# Patient Record
Sex: Female | Born: 1975
Health system: Southern US, Community
[De-identification: ages and names within clinical notes are randomized; demographics above are authoritative.]

## PROBLEM LIST (undated history)

## (undated) ENCOUNTER — Inpatient Hospital Stay (HOSPITAL_COMMUNITY): Payer: Self-pay

## (undated) DIAGNOSIS — B009 Herpesviral infection, unspecified: Secondary | ICD-10-CM

## (undated) DIAGNOSIS — D649 Anemia, unspecified: Secondary | ICD-10-CM

## (undated) DIAGNOSIS — N840 Polyp of corpus uteri: Secondary | ICD-10-CM

## (undated) DIAGNOSIS — R011 Cardiac murmur, unspecified: Secondary | ICD-10-CM

## (undated) DIAGNOSIS — Z5189 Encounter for other specified aftercare: Secondary | ICD-10-CM

## (undated) HISTORY — PX: DILATION AND CURETTAGE OF UTERUS: SHX78

## (undated) HISTORY — PX: WISDOM TOOTH EXTRACTION: SHX21

## (undated) HISTORY — PX: OTHER SURGICAL HISTORY: SHX169

---

## 2013-02-09 ENCOUNTER — Encounter: Payer: Self-pay | Admitting: Family Medicine

## 2013-12-04 ENCOUNTER — Observation Stay (HOSPITAL_COMMUNITY)
Admission: EM | Admit: 2013-12-04 | Discharge: 2013-12-05 | Disposition: A | Discharge status: Home / Self Care | Payer: BC Managed Care – PPO | Attending: Internal Medicine | Admitting: Internal Medicine

## 2013-12-04 ENCOUNTER — Other Ambulatory Visit: Payer: Self-pay

## 2013-12-04 ENCOUNTER — Encounter (HOSPITAL_COMMUNITY): Payer: Self-pay | Admitting: Emergency Medicine

## 2013-12-04 DIAGNOSIS — N920 Excessive and frequent menstruation with regular cycle: Secondary | ICD-10-CM | POA: Insufficient documentation

## 2013-12-04 DIAGNOSIS — E861 Hypovolemia: Secondary | ICD-10-CM | POA: Insufficient documentation

## 2013-12-04 DIAGNOSIS — R55 Syncope and collapse: Secondary | ICD-10-CM

## 2013-12-04 DIAGNOSIS — D62 Acute posthemorrhagic anemia: Principal | ICD-10-CM | POA: Insufficient documentation

## 2013-12-04 DIAGNOSIS — R011 Cardiac murmur, unspecified: Secondary | ICD-10-CM | POA: Insufficient documentation

## 2013-12-04 HISTORY — DX: Encounter for other specified aftercare: Z51.89

## 2013-12-04 HISTORY — DX: Herpesviral infection, unspecified: B00.9

## 2013-12-04 HISTORY — DX: Anemia, unspecified: D64.9

## 2013-12-04 HISTORY — DX: Polyp of corpus uteri: N84.0

## 2013-12-04 HISTORY — DX: Cardiac murmur, unspecified: R01.1

## 2013-12-04 LAB — COMPREHENSIVE METABOLIC PANEL
Albumin: 3.8 g/dL (ref 3.5–5.2)
Alkaline Phosphatase: 47 U/L (ref 39–117)
BUN: 15 mg/dL (ref 6–23)
Calcium: 8.6 mg/dL (ref 8.4–10.5)
Chloride: 104 mEq/L (ref 96–112)
GFR calc Af Amer: >90 mL/min (ref >90)
Glucose, Bld: 84 mg/dL (ref 70–99)
Potassium: 3.8 mEq/L (ref 3.5–5.1)
Sodium: 137 mEq/L (ref 135–145)
Total Bilirubin: 0.2 mg/dL — ABNORMAL LOW (ref 0.3–1.2)
Total Protein: 6.9 g/dL (ref 6.0–8.3)

## 2013-12-04 LAB — URINE MICROSCOPIC-ADD ON

## 2013-12-04 LAB — URINALYSIS, ROUTINE W REFLEX MICROSCOPIC
Nitrite: NEGATIVE
Protein, ur: 100 mg/dL — AB
Specific Gravity, Urine: 1.026 (ref 1.005–1.030)
Urobilinogen, UA: 1 mg/dL (ref 0.0–1.0)
pH: 6 (ref 5.0–8.0)

## 2013-12-04 LAB — CBC
HCT: 26.1 % — ABNORMAL LOW (ref 36.0–46.0)
MCV: 59.5 fL — ABNORMAL LOW (ref 78.0–100.0)
MCV: 65.4 fL — ABNORMAL LOW (ref 78.0–100.0)
Platelets: 235 10*3/uL (ref 150–400)
RBC: 3.51 MIL/uL — ABNORMAL LOW (ref 3.87–5.11)
RBC: 3.99 MIL/uL (ref 3.87–5.11)
RDW: 21.4 % — ABNORMAL HIGH (ref 11.5–15.5)
WBC: 5.1 10*3/uL (ref 4.0–10.5)
WBC: 7.3 10*3/uL (ref 4.0–10.5)

## 2013-12-04 LAB — PREGNANCY, URINE: Preg Test, Ur: NEGATIVE

## 2013-12-04 LAB — TECHNOLOGIST SMEAR REVIEW

## 2013-12-04 LAB — PREPARE RBC (CROSSMATCH)

## 2013-12-04 MED ORDER — SODIUM CHLORIDE 0.9 % IV BOLUS (SEPSIS)
1000.0000 mL | Freq: Once | INTRAVENOUS | Status: AC
  Administered 2013-12-04: 1000 mL via INTRAVENOUS

## 2013-12-04 NOTE — H&P (Signed)
Date: 12/04/2013               Patient Name:  Emily Myers MRN: [id number]  DOB: 09/10/1976 Age / Sex: 37 y.o., female   PCP: No Pcp Per Patient              Medical Service: Internal Medicine Teaching Service              Attending Physician: Dr. Aletta Edouard, MD    First Contact: Lewie Chamber, MS 2 Pager: [phone]  Second Contact: Dr. Garald Braver Pager: [phone]  Third Contact Dr.  Dineen Kid: [phone]       After Hours (After 5p/  First Contact Pager: [phone]  weekends / holidays): Second Contact Pager: [phone]   Chief Complaint: near syncope  History of Present Illness: [name] is a 37 yo AAF with PMH anemia due to heavy menstrual cycles who presents to the ED after near syncope at work. The patient reports she recently started her menstrual cycle this past weekend on Friday or Saturday and has had a heavier cycle than usual but is used to having much heavier menstrual bleeding than experienced today. Her cycles are usually every 21-25 days and last 5-6 days and have been normal about the last 4 cycles prior to this one. She has been going through 1 normal pad about every 2 hours since Monday and on her heavy day (Sunday) she was changing a super pad every 1 hour. She notes that she was standing up at work this morning and "felt like the room was spinning", then became hot, sweaty and closed her eyes as she fell to the ground. She denied losing consciousness but described the room as being "dark" due to her eyes closed, although she could hear everyone talking around her. She is not aware how long she was on the floor and was then brought to the ED.   She states that she has felt like this before with past cycles and sitting in a chair usually makes her feel better although this time she was unable to sit down before falling. She recently started taking a prenatal vitamin since Sunday (2 days ago) to help increase her iron.  She denies hitting her head or any trauma associated with  the fall. She has had 2 transfusions in the past (2003 and 2009 or 2010 as she cannot remember). She also notes her pain associated with this cycle is much worse causing her to take up to 5 tablets of naproxen a day for pain instead of her usual 1 a day during her cycle. She denies fevers, chills, abdominal pain, N/V/D, constipation, hematuria, dysuria, hematochezia, melena, hemoptysis, or hematemesis.   She is new to [facility] but was followed by a hematologist in [place] and was last seen in February 2014, but is not prescribed any medications currently. She has no PCP or OB/GYN since moving to the area.   Meds: No current facility-administered medications for this encounter.   No current outpatient prescriptions on file.    Allergies: Allergies as of 12/04/2013 - Review Complete 12/04/2013  Allergen Reaction Noted  . Aspirin Hives 12/04/2013   Past Medical History  Diagnosis Date  . Anemia   . Heart murmur   . Blood transfusion without reported diagnosis    Past Surgical History  Procedure Laterality Date  . Wisdom tooth extraction    . Iron infusions    . D&c x 2     Family History  Problem Relation Age  of Onset  . Family history unknown: Yes   History   Social History  . Marital Status: Single    Spouse Name: N/A    Number of Children: N/A  . Years of Education: N/A   Occupational History  . Not on file.   Social History Main Topics  . Smoking status: Never Smoker   . Smokeless tobacco: Never Used  . Alcohol Use: Yes     Comment: "socially"  . Drug Use: No  . Sexual Activity: No   Other Topics Concern  . Not on file   Social History Narrative   37 yo AAF who lives at home with her stepmom and her 2 daughters ages 50 and 88. She is a never smoker, social alcohol drinker described as 1-2 drinks a month, denies illicit drug use. She has some college education and works as a Scientist, research (medical) at [employer]. She recently moved from [place] to  [place] 4 months ago and has no PCP or OB/GYN established.    Review of Systems: Constitutional: positive for none, negative for chills, fevers and sweats Eyes: positive for none, negative for icterus and redness Ears, nose, mouth, throat, and face: positive for none, negative for epistaxis Respiratory: positive for none, negative for cough, dyspnea on exertion and hemoptysis Cardiovascular: positive for near-syncope, negative for chest pain, dyspnea, lower extremity edema and palpitations Gastrointestinal: positive for none, negative for abdominal pain, change in bowel habits, constipation, diarrhea and melena Genitourinary:positive for abnormal menstrual periods, negative for dysuria and hematuria Musculoskeletal:positive for none, negative for arthralgias, back pain and muscle weakness Neurological: negative Endocrine: positive for none, negative for temperature intolerance  Physical Exam: Blood pressure 120/56, pulse 59, temperature 98.5 F (36.9 C), temperature source Oral, resp. rate 14, last menstrual period 11/30/2013, SpO2 100.00%. BP 120/56  Pulse 59  Temp(Src) 98.5 F (36.9 C) (Oral)  Resp 14  SpO2 100%  LMP 11/30/2013 General appearance: alert, cooperative and no distress Head: Normocephalic, without obvious abnormality, atraumatic Eyes: mild pallor, PERRL, EOMI Throat: lips, mucosa, and tongue normal; teeth and gums normal Neck: no adenopathy, no carotid bruit, no JVD and supple, symmetrical, trachea midline Back: symmetric, no curvature. ROM normal. No CVA tenderness. Lungs: clear to auscultation bilaterally Heart: regular rate and rhythm, S1, S2 normal, no murmur, click, rub or gallop Abdomen: soft, non-tender; bowel sounds normal; no masses,  no organomegaly Extremities: extremities normal, atraumatic, no cyanosis or edema Pulses: 2+ and symmetric Skin: Skin color, texture, turgor normal. No rashes or lesions Neurologic: Grossly normal  Lab results: Basic  Metabolic Panel:  Recent Labs  16/10/96 0905  NA 137  K 3.8  CL 104  CO2 23  GLUCOSE 84  BUN 15  CREATININE 0.70  CALCIUM 8.6   Liver Function Tests:  Recent Labs  12/04/13 0905  AST 12  ALT 8  ALKPHOS 47  BILITOT 0.2*  PROT 6.9  ALBUMIN 3.8   No results found for this basename: LIPASE, AMYLASE,  in the last 72 hours No results found for this basename: AMMONIA,  in the last 72 hours CBC:  Recent Labs  12/04/13 0905  WBC 5.1  HGB 6.1*  HCT 20.9*  MCV 59.5*  PLT 235   Cardiac Enzymes: No results found for this basename: CKTOTAL, CKMB, CKMBINDEX, TROPONINI,  in the last 72 hours BNP: No results found for this basename: PROBNP,  in the last 72 hours D-Dimer: No results found for this basename: DDIMER,  in the last 72  hours CBG: No results found for this basename: GLUCAP,  in the last 72 hours Hemoglobin A1C: No results found for this basename: HGBA1C,  in the last 72 hours Fasting Lipid Panel: No results found for this basename: CHOL, HDL, LDLCALC, TRIG, CHOLHDL, LDLDIRECT,  in the last 72 hours Thyroid Function Tests: No results found for this basename: TSH, T4TOTAL, FREET4, T3FREE, THYROIDAB,  in the last 72 hours Anemia Panel: No results found for this basename: VITAMINB12, FOLATE, FERRITIN, TIBC, IRON, RETICCTPCT,  in the last 72 hours Coagulation: No results found for this basename: LABPROT, INR,  in the last 72 hours Urine Drug Screen: Drugs of Abuse  No results found for this basename: labopia,  cocainscrnur,  labbenz,  amphetmu,  thcu,  labbarb    Alcohol Level: No results found for this basename: ETH,  in the last 72 hours Urinalysis:  Recent Labs  12/04/13 0915  COLORURINE RED*  LABSPEC 1.026  PHURINE 6.0  GLUCOSEU NEGATIVE  HGBUR LARGE*  BILIRUBINUR MODERATE*  KETONESUR 15*  PROTEINUR 100*  UROBILINOGEN 1.0  NITRITE NEGATIVE  LEUKOCYTESUR MODERATE*  Results for Emily Myers, Emily Myers (MRN [id number]) as of 12/04/2013 14:41  Ref. Range  12/04/2013 09:15  Urine-Other No range found MUCOUS PRESENT  WBC, UA Latest Range: <3 WBC/hpf 0-2  RBC / HPF Latest Range: <3 RBC/hpf TOO NUMEROUS TO COUNT  Squamous Epithelial / LPF Latest Range: RARE  FEW (A)  Bacteria, UA Latest Range: RARE  FEW (A)  Results for Emily Myers, Emily Myers (MRN [id number]) as of 12/04/2013 14:41  Ref. Range 12/04/2013 09:15  Preg Test, Ur Latest Range: NEGATIVE  NEGATIVE   Imaging results:  No results found.  Other results: EKG: No previous for comparison HR: 66, sinus rhythm, normal axis, no ST changes or T wave inversions  Assessment & Plan by Problem: Principal Problem:   Acute blood loss anemia   #Acute anemia due to blood loss Patient has a history of heavy menstrual cycles with transfusions received in the past for anemia. She has been symptomatic in the past and is symptomatic today with near syncope. Her vitals have been stable and she is receiving 2 units PRBC in the ED and 1L NS bolus. Differential diagnosis includes acute anemia 2/2 increased blood loss as evidenced by her current heavy cycle vs. hemolysis (unlikely as her bilirubin and potassium is normal) vs. decreased bone marrow production (also unlikely as WBC and PLTC counts are normal on CBC).  - admit for transfusion and observation - recheck CBC after 2u PRBC - recheck orthostatic vital signs after transfer to floor and transfusions / bolus given - will start oral iron therapy on discharge - will obtain records from hematologist in [place]  #VTE ppx: SCDs  #Disposition: Discharge likely tomorrow if stable. Will need outpatient followup to establish care with PCP and OB/GYN   This is a Medical Student Note.  The care of the patient was discussed with Dr. Stacy Gardner and the assessment and plan was formulated with their assistance.  Please see their note for official documentation of the patient encounter.   Signed: Lewie Chamber, Med Student 12/04/2013, 2:17 PM

## 2013-12-04 NOTE — ED Provider Notes (Signed)
I saw and evaluated the patient, reviewed the resident's note and I agree with the findings and plan.  EKG Interpretation   None       Pt comes in with cc of near syncope. Noted to have anemia - actively having heavy bleeding, with Hb at 6. Will admit and start transfusion. Likely will need Gyne consultation. Hemodynamically stable.  CRITICAL CARE Performed by: Derwood Kaplan   Total critical care time: 45 minutes  Critical care time was exclusive of separately billable procedures and treating other patients.  Critical care was necessary to treat or prevent imminent or life-threatening deterioration.  Critical care was time spent personally by me on the following activities: development of treatment plan with patient and/or surrogate as well as nursing, discussions with consultants, evaluation of patient's response to treatment, examination of patient, obtaining history from patient or surrogate, ordering and performing treatments and interventions, ordering and review of laboratory studies, ordering and review of radiographic studies, pulse oximetry and re-evaluation of patient's condition.   Derwood Kaplan, MD 12/04/13 1651

## 2013-12-04 NOTE — ED Provider Notes (Signed)
CSN: [id number]     Arrival date & time 12/04/13  9629 History   None    Chief Complaint  Patient presents with  . Near Syncope   HPI 37 year old woman with PMH anemia who presents for near syncope.   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  She has a history of anemia and has taken iron supplementation in past but stopped due to constipation.  She has required blood transfusions twice in the past. She is having her menstrual period right now, heavy flow.  She recently moved here, has not established with a PCP or OBGYN.  Past Medical History  Diagnosis Date  . Anemia   . Heart murmur   . Blood transfusion without reported diagnosis    Past Surgical History  Procedure Laterality Date  . Wisdom tooth extraction    . Iron infusions     History reviewed. No pertinent family history. History  Substance Use Topics  . Smoking status: Never Smoker   . Smokeless tobacco: Never Used  . Alcohol Use: Yes     Comment: "socially"   OB History   Grav Para Term Preterm Abortions TAB SAB Ect Mult Living                 Review of Systems  Constitutional: Negative for fever, chills and fatigue.  Respiratory: Negative for cough and shortness of breath.   Cardiovascular: Negative for chest pain, palpitations and leg swelling.  Gastrointestinal: Negative for nausea, vomiting, abdominal pain, diarrhea, constipation and blood in stool.  Endocrine: Positive for cold intolerance.  Genitourinary: Positive for vaginal bleeding. Negative for dysuria and frequency.  Musculoskeletal: Negative for back pain and neck pain.  Neurological: Positive for dizziness and weakness. Negative for seizures, syncope, numbness and headaches.    Allergies  Aspirin  Home Medications  No current outpatient prescriptions on file. BP  115/54  Pulse 56  Temp(Src) 98.1 F (36.7 C) (Oral)  Resp 12  SpO2 100%  LMP 11/30/2013 Physical Exam  Constitutional: She is oriented to person, place, and time. She appears well-developed and well-nourished. No distress.  HENT:  Head: Normocephalic and atraumatic.  Eyes: Conjunctivae and EOM are normal. Pupils are equal, round, and reactive to light.  Neck: Normal range of motion. Neck supple.  Cardiovascular: Normal rate, regular rhythm and normal heart sounds.   Pulmonary/Chest: Effort normal and breath sounds normal.  Abdominal: Soft. Bowel sounds are normal. She exhibits no distension. There is no tenderness.  Musculoskeletal: Normal range of motion.  Neurological: She is alert and oriented to person, place, and time. No cranial nerve deficit.  Skin: Skin is warm and dry.  Patient refused rectal exam.   ED Course  Procedures (including critical care time) Labs Review Labs Reviewed  CBC - Abnormal; Notable for the following:    RBC 3.51 (*)    Hemoglobin 6.1 (*)    HCT 20.9 (*)    MCV 59.5 (*)    MCH 17.4 (*)    MCHC 29.2 (*)    RDW 21.4 (*)    All other components within normal limits  URINALYSIS, ROUTINE W REFLEX MICROSCOPIC - Abnormal; Notable for the following:    Color, Urine RED (*)    APPearance TURBID (*)    Hgb urine dipstick LARGE (*)  Bilirubin Urine MODERATE (*)    Ketones, ur 15 (*)    Protein, ur 100 (*)    Leukocytes, UA MODERATE (*)    All other components within normal limits  URINE MICROSCOPIC-ADD ON - Abnormal; Notable for the following:    Squamous Epithelial / LPF FEW (*)    Bacteria, UA FEW (*)    All other components within normal limits  PREGNANCY, URINE  TYPE AND SCREEN  PREPARE RBC (CROSSMATCH)   Imaging Review No results found.  EKG Interpretation   None       MDM  Symptomatic anemia- Dizziness, weakness this morning; reportedly did not lose consciousness, did not hit her head, no seizure activity.  Gave NS 1L bolus. Urine  pregnancy negative.  UA with dirty, with frank blood from menstrual fluid, moderate leucocytes but asymptomatic.  CBC with Hgb 6.1.  Type and screen, ordered transfusion of 2 units.   Admit to medicine especially given no reliable follow-up.   Rocco Serene, MD 12/04/13 1049

## 2013-12-04 NOTE — ED Notes (Signed)
Pt reports having dizziness and being hot followed by everything "going black" and then being unable to stop herself from falling just after arriving to work this morning.  Pt reports this has happened one time previously last year.  Hx of anemia.  Pt in NAD, A&O.

## 2013-12-04 NOTE — ED Notes (Signed)
Patient presents to ED via EMS from work with complaints of anemia and weakness, patient is on her menstrual cycle.

## 2013-12-04 NOTE — H&P (Signed)
Date: 12/04/2013               Patient Name:  Emily Myers MRN: [id number]  DOB: 06-06-1976 Age / Sex: 37 y.o., female   PCP: No Pcp Per Patient         Medical Service: Internal Medicine Teaching Service         Attending Physician: Dr. Dalphine Handing    First Contact: Lewie Chamber, MSIV Pager: [phone]  Second Contact: Dr. Garald Braver Pager: [phone]       After Hours (After 5p/  First Contact Pager: [phone]  weekends / holidays): Second Contact Pager: [phone]   Chief Complaint: Near Syncope  History of Present Illness:  Emily Myers is a 37 yo woman with history of iron deficiency anemia who presented to [facility] ED on 12/04/13 after a near syncopal episode at work.  She reports she stood up, the room started spinning, she began feeling hot and went to the ground.  The room turned black (eyes closed) but she could hear everything around here.  She has history of heavy menstrual cycles that last 5-6 days, and she has to change her pad (super long) every 1.5-2h.  Her cycles are irregular, every 21-25 days, lasting 5-6 days, and she usually passes clots, though the last 4 months have been improved with her diet change and weight loss.  She used to take 5-6 naproxen tablets/day during her cycle, but has only needed to take 1-2 tablets for the last few months.  She denies epigastric pain.  She has been evaluated for her anemia by Gyn and Hematology in the past in [place] (she moved to [place] about 4 months ago), but has never been told anything specific.  She was told as recent as February that she does not have a GIB.  She has had 2 blood transfusions (2003, 2009) with similar presentation.  She has had 2 D+Cs in the past due to uterine polyps.  She reports having anemia all her life, and had her first child at 37 yo.  2 days ago, she started taking prenatal vitamins because she heard that formulation works better than ordinary iron supplementation.    Meds: No current facility-administered medications for  this encounter.   No current outpatient prescriptions on file.    Allergies: Allergies as of 12/04/2013 - Review Complete 12/04/2013  Allergen Reaction Noted  . Aspirin Hives 12/04/2013   Past Medical History  Diagnosis Date  . Anemia   . Heart murmur   . Blood transfusion without reported diagnosis    Past Surgical History  Procedure Laterality Date  . Wisdom tooth extraction    . Iron infusions    . D&c x 2     Family History - Mother with bipolar  History   Social History  . Marital Status: Single    Spouse Name: N/A    Number of Children: N/A  . Years of Education: N/A   Occupational History  . Not on file.   Social History Main Topics  . Smoking status: Never Smoker   . Smokeless tobacco: Never Used  . Alcohol Use: Yes     Comment: "socially"  . Drug Use: No  . Sexual Activity: No   Other Topics Concern  . Not on file   Social History Narrative   37 yo AAF who lives at home with her stepmom and her 2 daughters ages 41 and 40. She is a never smoker, social alcohol drinker described as 1-2 drinks a month,  denies illicit drug use. 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    Review of Systems: Constitutional: Denies fever, chills, diaphoresis, appetite change   HEENT: Denies photophobia, eye pain, redness, hearing loss, ear pain, congestion, sore throat, rhinorrhea, sneezing, mouth sores, trouble swallowing, neck pain, neck stiffness and tinnitus.  Respiratory: Denies SOB, DOE, cough, chest tightness, and wheezing.  Cardiovascular: Denies chest pain, palpitations and leg swelling.  Gastrointestinal: Denies nausea, vomiting, abdominal pain, diarrhea, constipation,blood in stool and abdominal distention.  Genitourinary: Denies dysuria, urgency, frequency, hematuria, flank pain and difficulty urinating.    Musculoskeletal: Denies myalgias, back pain, joint swelling, arthralgias and gait problem.  Skin: Denies pallor, rash and wound.  Neurological: Denies seizures, numbness   Physical Exam: Blood pressure 120/56, pulse 59, temperature 98.5 F (36.9 C), temperature source Oral, resp. rate 14, last menstrual period 11/30/2013, SpO2 100.00%. General: resting in bed, no acute distress (we found her hurrying to the bathroom in the ED hallway) HEENT: PERRL, EOMI, no scleral icterus, +conjunctival pallor Cardiac: RRR, + systolic flow murmur throughout Pulm: clear to auscultation bilaterally, moving normal volumes of air Abd: soft, nontender, nondistended, BS normoactive Ext: warm and well perfused, no pedal edema Neuro: alert and oriented X3, cranial nerves II-XII grossly intact  Lab results: Basic Metabolic Panel:  Recent Labs  16/10/96 0905  NA 137  K 3.8  CL 104  CO2 23  GLUCOSE 84  BUN 15  CREATININE 0.70  CALCIUM 8.6  AG: 10  Liver Function Tests:  Recent Labs  12/04/13 0905  AST 12  ALT 8  ALKPHOS 47  BILITOT 0.2*  PROT 6.9  ALBUMIN 3.8   CBC:  Recent Labs  12/04/13 0905  WBC 5.1  HGB 6.1*  HCT 20.9*  MCV 59.5*  PLT 235   Urinalysis:  Recent Labs  12/04/13 0915  COLORURINE RED*  LABSPEC 1.026  PHURINE 6.0  GLUCOSEU NEGATIVE  HGBUR LARGE*  BILIRUBINUR MODERATE*  KETONESUR 15*  PROTEINUR 100*  UROBILINOGEN 1.0  NITRITE NEGATIVE  LEUKOCYTESUR MODERATE*     12/04/2013 09:15  Urine-Other MUCOUS PRESENT  WBC, UA 0-2  RBC / HPF TOO NUMEROUS TO COUNT  Squamous Epithelial / LPF FEW (A)  Bacteria, UA FEW (A)  Preg Test, Ur NEGATIVE    Other results: EKG: sinus, regular rate and rhythm, normal axis, no ST/T wave abnormalities  Assessment & Plan by Problem: Emily Myers is a 37 yo woman with history of iron deficiency anemia who is admitted on 12/04/13 after a near syncopal episode.  #Acute blood loss anemia: Most likely etiology of pre-syncope, as  she has had similar episodes in the past.  Regarding etiology of anemia, likely related to blood loss from heavy menstrual flow. Urine preg negative.  Marrow underproduction not likely given normal WBC & platelet counts.  Hemolysis unlikely given normal K and bilirubin - she does have schistocytes on smear but only 2-5/hpf.  Smear also reveals elliptocytes and target cells with polychromasia and large platelets.  While target cells are seen in thalassemia, in this patient who has known heavy cycles, it is more likely secondary to iron def anemia. She has no history of liver disease, and LFTs are wnl.  Elliptocytes are also seen in iron def anemia.  She refused FOBT at bedside by EDP and our team.  Will not order FOBT stool  collection at this point, as will be contaminated given pad usage.   -Admit to med-surg (obs) -CBC post 2u PRBCs (ordered in ED) and with AM labs -She will have received a large iron load with transfusion, so I suggest discharge if stable tomorrow with PO iron TID and check ferritin/b12/folate/retic with CBC outpatient, if hemodynamically stable, then in 3 months) -- if no significant improvement consider further labs to evaluate for thalassemia (but would also get records from hematologist, as this work up may have already been done)  -Will need outpatient PCP and Gyn -Orthostatic vital signs  #Mennorhagia: Unclear etiology, but reports history of uterine polyps.  Reports cycles have been improving. -GYN follow up outpatient  #VTE ppx: SCDs given acute anemia  --> Remainder per MSIV note  Dispo: Disposition is deferred at this time, awaiting improvement of current medical problems. Anticipated discharge in approximately 1-2 day(s).   The patient does not have a current PCP (No Pcp Per Patient) and may need an [facility] hospital follow-up appointment after discharge.  The patient does not have transportation limitations that hinder transportation to clinic  appointments.  Signed: Belia Heman, MD 12/04/2013, 2:05 PM

## 2013-12-04 NOTE — H&P (Signed)
  I have seen and examined the patient myself, and I have reviewed the note by Lewie Chamber, MS IV and was present during the interview and physical exam.  Please see my separate H&P for additional findings, assessment, and plan.   Signed: Belia Heman, MD 12/04/2013, 2:48 PM

## 2013-12-05 ENCOUNTER — Encounter (HOSPITAL_COMMUNITY): Payer: Self-pay | Admitting: General Practice

## 2013-12-05 DIAGNOSIS — R011 Cardiac murmur, unspecified: Secondary | ICD-10-CM

## 2013-12-05 LAB — TYPE AND SCREEN
ABO/RH(D): O POS
Antibody Screen: NEGATIVE
Unit division: 0

## 2013-12-05 LAB — CBC
HCT: 26.4 % — ABNORMAL LOW (ref 36.0–46.0)
Hemoglobin: 8.1 g/dL — ABNORMAL LOW (ref 12.0–15.0)
MCH: 20 pg — ABNORMAL LOW (ref 26.0–34.0)
MCHC: 30.7 g/dL (ref 30.0–36.0)
MCV: 65.2 fL — ABNORMAL LOW (ref 78.0–100.0)
Platelets: 166 10*3/uL (ref 150–400)
RBC: 4.05 MIL/uL (ref 3.87–5.11)
RDW: 25.3 % — ABNORMAL HIGH (ref 11.5–15.5)
WBC: 7.8 10*3/uL (ref 4.0–10.5)

## 2013-12-05 MED ORDER — DOCUSATE SODIUM 100 MG PO CAPS: 100.0000 mg | ORAL_CAPSULE | Freq: Two times a day (BID) | ORAL | Status: DC

## 2013-12-05 MED ORDER — FERROUS SULFATE 325 (65 FE) MG PO TBEC: 325.0000 mg | DELAYED_RELEASE_TABLET | Freq: Three times a day (TID) | ORAL | Status: DC

## 2013-12-05 NOTE — Progress Notes (Signed)
Utilization review completed.  

## 2013-12-05 NOTE — H&P (Signed)
    Day 1 of stay      Patient name: Emily Myers  Medical record number: [id number]  Date of birth: [birth year]  Summary: 37 y.o. female with symptomatic anemia etiology menorrhagia.  Subjective: Feeling much improved after blood transfusion.   Objective: Exam today is significant for systolic murmur 2/6, no tachycardia, clear lungs, no JVD, no pedal edema.     Plan  Symptomatic Anemia - Transfused 2 units of PRBC, hgb came up appropriate, clinically improved, can be worked up as outpatient. Can be discharged with referral to [facility] clinic and PCP.  Agree with ferrous sulphate prescription on discharge.    I have seen and evaluated this patient and discussed it with my IM resident team.  Please see the rest of the plan per resident note from today.   Yaasir Menken 12/05/2013, 2:10 PM.

## 2013-12-05 NOTE — Progress Notes (Signed)
I have seen the patient and reviewed the daily progress note by Lewie Chamber MS IV and discussed the care of the patient with them.  See below for documentation of my findings, assessment, and plans.  Subjective: No overnight events. She states that she feels much better today with no dizziness, SOB, heart palpitations. Her menstrual bleeding has stopped. She has been walking to the bathroom and within the room with no dizziness.   Objective: Vital signs in last 24 hours: Filed Vitals:   12/04/13 1845 12/04/13 2008 12/04/13 2100 12/04/13 2200  BP: 130/64 126/54 131/55 142/54  Pulse: 57 63 61 68  Temp: 98.8 F (37.1 C) 98.3 F (36.8 C) 98.2 F (36.8 C) 98 F (36.7 C)  TempSrc:      Resp: 16 18 16 16   SpO2:  100% 100% 100%   Weight change:   Intake/Output Summary (Last 24 hours) at 12/05/13 1357 Last data filed at 12/05/13 0900  Gross per 24 hour  Intake 1407.5 ml  Output      0 ml  Net 1407.5 ml   Vitals reviewed. General: resting in bed, NAD HEENT: PERRL, EOMI, no scleral icterus Cardiac: RRR, no rubs, 2/6 holosystolic murmur heard throughout the precordium, no gallops or rubs.  Pulm: clear to auscultation bilaterally, no wheezes, rales, or rhonchi Abd: soft, nontender, nondistended, BS present Ext: warm and well perfused, no pedal edema Neuro: alert and oriented X3, cranial nerves II-XII grossly intact, strength and sensation to light touch equal in bilateral upper and lower extremities  Lab Results: Reviewed and documented in Electronic Record Micro Results: Reviewed and documented in Electronic Record Studies/Results: Reviewed and documented in Electronic Record Medications: I have reviewed the patient's current medications. Scheduled Meds: Continuous Infusions: PRN Meds:.   Assessment/Plan: 37 yo female with PMH anemia and menorrhagia who presented with acute blood loss due to menorrhagia with current menstrual cycle.   Acute anemia due to blood loss  secondary to menorrhagia -She has history of menorrhagia with iron deficiency anemia requiring blood transfusions in the pest and presented with near syncope event and found to have Hgb of 6.1. She denies every being tested with bleeding diatheis, and denies family history of bleeding disorders. She has had two deliveries with no post-partum hemorrhaging. She received 2u PRBC in the ED, just prior to her admission with good response and increase in HgB from 6.1 to 8. She has required at least two D&C for hemorrhaging but only small fibroids seen in pelvic US and uterine polyp removed a while back. Her bleeding seems to be out of proportion of what would be expected with bleeding from uterine fibroids and has not responded to OCPs (she has not tried progestin IUD) and she could benefit from bleeding diathesis testing.   - records from hematologist and OB/GYN obtained and sent to medical records to be scanned to Epic  - Start oral iron therapy TID with meals and docusate BID to prevent constipation (low iron level in February 2014 per records)  - she will need further GYN care/referral as outpatient to include bleeding disorder work up.   Heart murmur - She reports seeing a Cardiologist as a child and being told that she would grow out of it. She has prevalent holosystolic murmur on physical exam that could be explained by a VSD but she likely needs Cardiology follow up for this in the outpatient setting. She will follow up with her PCP upon her discharge and we will recommend a referral  for a 2D echo or Cardiology as outpatient.    VTE ppx: SCDs   Disposition: Discharge today, f/u and establish care with [facility].   Dispo: She will be discharged today.   The patient will follow up with her new PCP at the [facility].    The patient does not have transportation limitations that hinder transportation to clinic appointments.  .Services Needed at time of discharge: Y = Yes, Blank =  No PT:   OT:   RN:   Equipment:   Other:     LOS: 1 day   Ky Barban, MD 12/05/2013, 1:57 PM

## 2013-12-05 NOTE — Progress Notes (Signed)
Subjective: Patient resting in bed with no complaints. Denies dizziness, vertigo, N/V/D. Notes her menstrual bleeding is decreasing significantly. Has been ambulating to the bathroom with no difficulty.  Objective: Vital signs in last 24 hours: Filed Vitals:   12/04/13 1845 12/04/13 2008 12/04/13 2100 12/04/13 2200  BP: 130/64 126/54 131/55 142/54  Pulse: 57 63 61 68  Temp: 98.8 F (37.1 C) 98.3 F (36.8 C) 98.2 F (36.8 C) 98 F (36.7 C)  TempSrc:      Resp: 16 18 16 16   SpO2:  100% 100% 100%   Weight change:   Intake/Output Summary (Last 24 hours) at 12/05/13 1142 Last data filed at 12/05/13 0900  Gross per 24 hour  Intake   1420 ml  Output      0 ml  Net   1420 ml   BP 142/54  Pulse 68  Temp(Src) 98 F (36.7 C) (Oral)  Resp 16  SpO2 100%  LMP 11/30/2013 General appearance: alert, cooperative and no distress Head: Normocephalic, without obvious abnormality, atraumatic Eyes: mild conjunctival pallor, EOMI, PERRL Throat: lips, mucosa, and tongue normal; teeth and gums normal Back: symmetric, no curvature. ROM normal. No CVA tenderness. Lungs: clear to auscultation bilaterally Heart: regular rate and rhythm, S1, S2 normal and 2/6 holosystolic murmur heard throughout Abdomen: soft, non-tender; bowel sounds normal; no masses,  no organomegaly Extremities: extremities normal, atraumatic, no cyanosis or edema Skin: Skin color, texture, turgor normal. No rashes or lesions Lab Results: Basic Metabolic Panel:  Recent Labs Lab 12/04/13 0905  NA 137  K 3.8  CL 104  CO2 23  GLUCOSE 84  BUN 15  CREATININE 0.70  CALCIUM 8.6   Liver Function Tests:  Recent Labs Lab 12/04/13 0905  AST 12  ALT 8  ALKPHOS 47  BILITOT 0.2*  PROT 6.9  ALBUMIN 3.8   No results found for this basename: LIPASE, AMYLASE,  in the last 168 hours No results found for this basename: AMMONIA,  in the last 168 hours CBC:  Recent Labs Lab 12/04/13 2300 12/05/13 0413  WBC 7.3 7.8    HGB 8.0* 8.1*  HCT 26.1* 26.4*  MCV 65.4* 65.2*  PLT 188 166   Cardiac Enzymes: No results found for this basename: CKTOTAL, CKMB, CKMBINDEX, TROPONINI,  in the last 168 hours BNP: No results found for this basename: PROBNP,  in the last 168 hours D-Dimer: No results found for this basename: DDIMER,  in the last 168 hours CBG: No results found for this basename: GLUCAP,  in the last 168 hours Hemoglobin A1C: No results found for this basename: HGBA1C,  in the last 168 hours Fasting Lipid Panel: No results found for this basename: CHOL, HDL, LDLCALC, TRIG, CHOLHDL, LDLDIRECT,  in the last 168 hours Thyroid Function Tests: No results found for this basename: TSH, T4TOTAL, FREET4, T3FREE, THYROIDAB,  in the last 168 hours Coagulation: No results found for this basename: LABPROT, INR,  in the last 168 hours Anemia Panel: No results found for this basename: VITAMINB12, FOLATE, FERRITIN, TIBC, IRON, RETICCTPCT,  in the last 168 hours Urine Drug Screen: Drugs of Abuse  No results found for this basename: labopia, cocainscrnur, labbenz, amphetmu, thcu, labbarb    Alcohol Level: No results found for this basename: ETH,  in the last 168 hours Urinalysis:  Recent Labs Lab 12/04/13 0915  COLORURINE RED*  LABSPEC 1.026  PHURINE 6.0  GLUCOSEU NEGATIVE  HGBUR LARGE*  BILIRUBINUR MODERATE*  KETONESUR 15*  PROTEINUR 100*  UROBILINOGEN 1.0  NITRITE NEGATIVE  LEUKOCYTESUR MODERATE*    Micro Results: No results found for this or any previous visit (from the past 240 hour(s)). Studies/Results: No results found. Medications:  I have reviewed the patient's current medications. Prior to Admission:  No prescriptions prior to admission   Scheduled: Scheduled Meds: Continuous Infusions: PRN Meds:.   Assessment/Plan: Principal Problem:   Acute blood loss anemia Active Problems:   Menorrhagia  Assessment: 37 yo female with PMH anemia and menorrhagia who presented with acute  blood loss due to menorrhagia with current menstrual cycle.  #Acute anemia due to blood loss  Patient has a history of heavy menstrual cycles with transfusions received in the past for anemia. She received 2u PRBC in ER with good response of Hgb (6.1>>8). Menstrual bleeding much less today. - records from hematologist and OB/GYN obtained and sent to medical records to be scanned to epic - start oral iron therapy with docusate (low iron level in February 2014 per records)   #VTE ppx: SCDs   #Disposition: Discharge today, f/u and est care with [facility], appointment made and in epic.    This is a Psychologist, occupational Note.  The care of the patient was discussed with Dr. Sara Chu and the assessment and plan formulated with their assistance.  Please see their attached note for official documentation of the daily encounter.   LOS: 1 day   Lewie Chamber, Med Student 12/05/2013, 11:42 AM

## 2013-12-09 DIAGNOSIS — R011 Cardiac murmur, unspecified: Secondary | ICD-10-CM | POA: Diagnosis present

## 2013-12-09 NOTE — Discharge Summary (Signed)
Name: Emily Myers MRN: [id number] DOB: 12/17/1976 37 y.o. PCP: No Pcp Per Patient  Date of Admission: 12/04/2013  8:11 AM Date of Discharge: 12/05/2013 Attending Physician: Dr. Aletta Edouard, MD  Discharge Diagnosis: Principal Problem:   Acute blood loss anemia - Stable Hemoglobin prior to discharge after transfusion of 2 units of pRBCs Active Problems:   Menorrhagia - Resolved prior to discharge   Heart murmur - Chronic, present since childhood   Discharge Medications:   Medication List         docusate sodium 100 MG capsule  Commonly known as:  COLACE  Take 1 capsule (100 mg total) by mouth 2 (two) times daily.     ferrous sulfate 325 (65 FE) MG EC tablet  Take 1 tablet (325 mg total) by mouth 3 (three) times daily with meals.        Disposition and follow-up:   Emily Myers was discharged from [facility] in Good condition.  At the hospital follow up visit please address:  1.  Follow up with GYN for further evaluation of menorrhagia. She may also benefit from evaluation of bleeding diathesis testing. Assure compliance to iron supplementation. Please consider ordering a 2D echo and referral to Cardiology for further evaluation of her heart murmur.  2.  Labs / imaging needed at time of follow-up: Repeat CBC after 4-6 weeks of iron supplementation  3.  Pending labs/ test needing follow-up: None.   Follow-up Appointments: Follow-up Information   [facility]2013. (Appointment time 2pm--Mail new patient packet to [facility] before appointment )    Contact information:   [address] [place] [address]       Discharge Instructions: Discharge Orders   Future Appointments Provider Department Dept Phone   12/17/2013 2:00 PM Beverely Low, MD [facility] Medicine [facility] [phone]   Future Orders Complete By Expires   Diet - low sodium heart healthy  As directed    Increase  activity slowly  As directed       Consultations:  None  Procedures Performed:  None  Admission HPI:  [name] is a 37 yo woman with history of iron deficiency anemia who presented to [facility] ED on 12/04/13 after a near syncopal episode at work. She reports she stood up, the room started spinning, she began feeling hot and went to the ground. The room turned black (eyes closed) but she could hear everything around here. She has history of heavy menstrual cycles that last 5-6 days, and she has to change her pad (super long) every 1.5-2h. Her cycles are irregular, every 21-25 days, lasting 5-6 days, and she usually passes clots, though the last 4 months have been improved with her diet change and weight loss. She used to take 5-6 naproxen tablets/day during her cycle, but has only needed to take 1-2 tablets for the last few months. She denies epigastric pain. She has been evaluated for her anemia by Gyn and Hematology in the past in [place] (she moved to [place] about 4 months ago), but has never been told anything specific. She was told as recent as February that she does not have a GIB. She has had 2 blood transfusions (2003, 2009) with similar presentation. She has had 2 D+Cs in the past due to uterine polyps. She reports having anemia all her life, and had her first child at 30 yo. 2 days ago, she started taking prenatal vitamins because she heard  that formulation works better than ordinary iron supplementation   Hospital Course by problem list:  1. Acute blood loss anemia- She was admitted for near-syncope while standing which was thought to be most likely secondary to hypovolemia from blood loss. She reports that her menstrual cycle was extremely heavier this month as compared to previous months but she is still accustomed to even heavier cycles. Often becomes pre-syncopal during heavy cycles. She has been diagnosed with anemia for many years and states her cycles have been heavy since menarche at 37  years old. Hgb 6.1 in ER and after 2u PRBC improved to 8. Her baseline is unknown. Vitals remained stable during admission. Hemolysis ruled out with normal bilirubin. Bone marrow suppression ruled out with normal WBC and platelet count. She denies family history of bleeding disorders but she has never been tested for bleeding diathesis. She was started on oral iron TID  and docusate on discharge and was encouraged to follow up with her PCP for CBC trending and possible referral to GYN.   2. Menorrhagia - She has history of heavy menstrual cycles and associated anemia. Records obtained from hematologist and OB/GYN in [place] and located in [place] under "chart review'media". Etiology to her menorrhagia remains unclear although she has undergone D&C with fragmented benign polyp removal. May benefit from outpatient ultrasound as her last workup was performed in 2011 [facility], hysteroscopy). She has tried OCPs and progestin treatment previously with no improvement in menorrhagia. She has not tried IUD with progestin. She was encouraged to follow up with her PCP for monitoring of her anemia and possible referral to GYN.   3. Heart Murmur -Holosystolic murmur heard throughout but loudest in R/L 2nd ICS. Patient reports she has had the murmur her whole life and underwent EKG and echo around age 28. Pre-syncopal episodes could be multifactorial if any heart pathology is discovered on further workup. Would benefit from a repeat 2D Echo in the outpatient setting.     Discharge Vitals:   BP 121/60  Pulse 51  Temp(Src) 99 F (37.2 C) (Oral)  Resp 18  SpO2 100%  LMP 11/30/2013  Discharge Labs:  Basic Metabolic Panel:   Recent Labs  Lab  12/04/13 0905   NA  137   K  3.8   CL  104   CO2  23   GLUCOSE  84   BUN  15   CREATININE  0.70   CALCIUM  8.6    Liver Function Tests:   Recent Labs  Lab  12/04/13 0905   AST  12   ALT  8   ALKPHOS  47   BILITOT  0.2*   PROT  6.9   ALBUMIN  3.8    CBC:    Recent Labs  Lab  12/04/13 2300  12/05/13 0413   WBC  7.3  7.8   HGB  8.0*  8.1*   HCT  26.1*  26.4*   MCV  65.4*  65.2*   PLT  188  166     Urinalysis:   Recent Labs  Lab  12/04/13 0915   COLORURINE  RED*   LABSPEC  1.026   PHURINE  6.0   GLUCOSEU  NEGATIVE   HGBUR  LARGE*   BILIRUBINUR  MODERATE*   KETONESUR  15*   PROTEINUR  100*   UROBILINOGEN  1.0   NITRITE  NEGATIVE   LEUKOCYTESUR  MODERATE*     Signed: Ky Barban, MD 12/06/2013, 9:34 PM  Time Spent on Discharge: 35 minutes Services Ordered on Discharge: None Equipment Ordered on Discharge: None

## 2013-12-12 NOTE — Discharge Summary (Signed)
INTERNAL MEDICINE ATTENDING DISCHARGE COSIGN   I evaluated the patient on the day of discharge and discussed the discharge plan with my resident team. I agree with the discharge documentation and disposition.   Aletta Edouard 12/12/2013, 2:36 PM

## 2013-12-17 ENCOUNTER — Inpatient Hospital Stay: Payer: Self-pay | Admitting: Family Medicine

## 2016-07-11 ENCOUNTER — Other Ambulatory Visit (HOSPITAL_COMMUNITY)
Admission: RE | Admit: 2016-07-11 | Discharge: 2016-07-11 | Disposition: A | Discharge status: Home / Self Care | Payer: 59 | Source: Ambulatory Visit | Attending: Obstetrics and Gynecology | Admitting: Obstetrics and Gynecology

## 2016-07-11 DIAGNOSIS — N979 Female infertility, unspecified: Secondary | ICD-10-CM | POA: Insufficient documentation

## 2016-07-12 LAB — PROGESTERONE: PROGESTERONE: 12 ng/mL

## 2016-07-16 ENCOUNTER — Other Ambulatory Visit (HOSPITAL_COMMUNITY): Payer: Self-pay | Admitting: Obstetrics and Gynecology

## 2016-07-16 DIAGNOSIS — N979 Female infertility, unspecified: Secondary | ICD-10-CM

## 2016-07-23 ENCOUNTER — Ambulatory Visit (HOSPITAL_COMMUNITY): Payer: 59

## 2016-07-23 ENCOUNTER — Ambulatory Visit (HOSPITAL_COMMUNITY)
Admission: RE | Admit: 2016-07-23 | Discharge: 2016-07-23 | Disposition: A | Discharge status: Home / Self Care | Payer: 59 | Source: Ambulatory Visit | Attending: Obstetrics and Gynecology | Admitting: Obstetrics and Gynecology

## 2016-07-23 DIAGNOSIS — N979 Female infertility, unspecified: Secondary | ICD-10-CM | POA: Insufficient documentation

## 2016-07-23 DIAGNOSIS — D259 Leiomyoma of uterus, unspecified: Secondary | ICD-10-CM | POA: Insufficient documentation

## 2016-07-23 MED ORDER — IOPAMIDOL (ISOVUE-300) INJECTION 61%
30.0000 mL | Freq: Once | INTRAVENOUS | Status: AC | PRN
  Administered 2016-07-23: 12 mL

## 2017-07-12 DIAGNOSIS — Z01419 Encounter for gynecological examination (general) (routine) without abnormal findings: Secondary | ICD-10-CM | POA: Diagnosis not present

## 2017-08-04 DIAGNOSIS — N920 Excessive and frequent menstruation with regular cycle: Secondary | ICD-10-CM | POA: Diagnosis not present

## 2017-10-27 DIAGNOSIS — Z1231 Encounter for screening mammogram for malignant neoplasm of breast: Secondary | ICD-10-CM | POA: Diagnosis not present

## 2017-11-09 DIAGNOSIS — L42 Pityriasis rosea: Secondary | ICD-10-CM | POA: Diagnosis not present

## 2017-12-08 ENCOUNTER — Encounter: Payer: Self-pay | Admitting: Allergy & Immunology

## 2017-12-08 ENCOUNTER — Ambulatory Visit: Payer: 59 | Admitting: Allergy & Immunology

## 2017-12-08 VITALS — BP 118/70 | HR 62 | Temp 98.2°F | Resp 16 | Ht 62.5 in | Wt 186.0 lb

## 2017-12-08 DIAGNOSIS — J302 Other seasonal allergic rhinitis: Secondary | ICD-10-CM | POA: Diagnosis not present

## 2017-12-08 DIAGNOSIS — J3089 Other allergic rhinitis: Secondary | ICD-10-CM

## 2017-12-08 NOTE — Progress Notes (Addendum)
NEW PATIENT  Date of Service/Encounter:  12/08/17  Referring provider: Patient, No Pcp Per   Assessment:   Seasonal and perennial allergic rhinitis (trees, weeds, grasses, dust mites, dog and cockroach)  Wheezing - seems more like nasal obstruction from her history   Plan/Recommendations:   1. Seasonal and perennial allergic rhinitis - Testing today showed: trees, weeds, grasses, dust mites, dog and cockroach - Avoidancmeasures provided. - Start taking: Zyrtec (cetirizine) 10mg  tablet once daily and Nasacort (triamcinolone) two sprays per nostril daily - You can use an extra dose of the antihistamine, if needed, for breakthrough symptoms.  - Consider nasal saline rinses 1-2 times daily to remove allergens from the nasal cavities as well as help with mucous clearance (this is especially helpful to do before the nasal sprays are given) - Consider allergy shots as a means of long-term control. - Allergy shots "re-train" and "reset" the immune system to ignore environmental allergens and decrease the resulting immune response to those allergens (sneezing, itchy watery eyes, runny nose, nasal congestion, etc).    - Allergy shots improve symptoms in 75-85% of patients.  - We can discuss more at the next appointment if the medications are not working for you.  2. Return in about 3 months (around 03/08/2018).  Subjective:   Emily Myers is a 41 y.o. female presenting today for evaluation of  Chief Complaint  Patient presents with  . Allergic Rhinitis     runny nose.    Emily Myers has a history of the following: Patient Active Problem List   Diagnosis Date Noted  . Heart murmur 12/09/2013  . Acute blood loss anemia 12/04/2013  . Menorrhagia 12/04/2013    History obtained from: chart review and patient.  Emily Myers was referred by Patient, No Pcp Per.     Emily Myers is a 41 y.o. female presenting for an evaluation of chronic rhinorrhea and congestion. Her husband is  the main complainer of her symptoms. She has rhinorrhea all of the time and lots of throat clearing nasal clearing in the mornings. Symptoms occur throughout the year. It never gets worst around any paritcular environment. It is constantly an issue. Husband reports "wheezing at night", but she has never needed an inhaler and has never needed prednisone for breathing.   Emily Myers has never needed an inhaler. However, she does feel fatigue when she walks up and down the stairs. This has been an issue only since they moved into a new home. She denies awakenings at night from coughing. She has never needed hospitalization for her breathing. She  She does have intermittent heartburn when she eats salsa at night. She did have it during pregnancy as well. But this is not a chronic problem and she does not take a daily medication for these symptoms. She tolerates all of the major food allergens without adverse event.    Otherwise, there is no history of other atopic diseases, including asthma, drug allergies, food allergies, stinging insect allergies, or urticaria. There is no significant infectious history. Vaccinations are up to date.    Past Medical History: Patient Active Problem List   Diagnosis Date Noted  . Heart murmur 12/09/2013  . Acute blood loss anemia 12/04/2013  . Menorrhagia 12/04/2013    Medication List:  Allergies as of 12/08/2017      Reactions   Aspirin Hives      Medication List        Accurate as of 12/08/17  1:22 PM. Always use your most recent  med list.          ferrous sulfate 325 (65 FE) MG EC tablet Take 1 tablet (325 mg total) by mouth 3 (three) times daily with meals.   fluticasone 0.05 % cream Commonly known as:  CUTIVATE Apply 1 application topically 2 (two) times daily.       Birth History: non-contributory.   Developmental History: non-contributory.   Past Surgical History: Past Surgical History:  Procedure Laterality Date  . D&C x 2     Polyp  removal 2011 by Dr. Cheri Rous in [place]  . iron infusions    . WISDOM TOOTH EXTRACTION       Family History: Family History  Problem Relation Age of Onset  . Eczema Mother      Social History: Emily Myers lives at home with her family. They live in a 57mo home. There is laminate flooring in the main living areas and carpeting in the bedrooms. They have gas and electric heating as well as central cooling. There are no animals inside or outside of the home. There are dust mite coverings on the bedding. There is no smoking exposure. 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    Review of Systems: a 14-point review of systems is pertinent for what is mentioned in HPI.  Otherwise, all other systems were negative. Constitutional: negative other than that listed in the HPI Eyes: negative other than that listed in the HPI Ears, nose, mouth, throat, and face: negative other than that listed in the HPI Respiratory: negative other than that listed in the HPI Cardiovascular: negative other than that listed in the HPI Gastrointestinal: negative other than that listed in the HPI Genitourinary: negative other than that listed in the HPI Integument: negative other than that listed in the HPI Hematologic: negative other than that listed in the HPI Musculoskeletal: negative other than that listed in the HPI Neurological: negative other than that listed in the HPI Allergy/Immunologic: negative other than that listed in the HPI    Objective:   Blood pressure 118/70, pulse 62, temperature 98.2 F (36.8 C), temperature source Oral, resp. rate 16, height 5' 2.5" (1.588 m), weight 186 lb (84.4 kg), SpO2 98 %. Body mass index is 33.48 kg/m.   Physical Exam:  General: Alert, interactive, in no acute distress. Pleasant female.  Eyes: No conjunctival injection bilaterally, no discharge on the right, no discharge on the left and no  Horner-Trantas dots present. PERRL bilaterally. EOMI without pain. No photophobia.  Ears: Right TM pearly gray with normal light reflex, Left TM pearly gray with normal light reflex, Right TM intact without perforation and Left TM intact without perforation.  Nose/Throat: External nose within normal limits and septum midline. Turbinates edematous and pale with clear discharge. Posterior oropharynx erythematous without cobblestoning in the posterior oropharynx. Tonsils 2+ without exudates.  Tongue without thrush. Neck: Supple without thyromegaly. Trachea midline. Adenopathy: no enlarged lymph nodes appreciated in the anterior cervical, occipital, axillary, epitrochlear, inguinal, or popliteal regions. Lungs: Clear to auscultation without wheezing, rhonchi or rales. No increased work of breathing. CV: Normal S1/S2. No murmurs. Capillary refill <2 seconds.  Abdomen: Nondistended, nontender. No guarding or rebound tenderness. Bowel sounds present in all fields and hypoactive  Skin: Warm and dry, without lesions or rashes. Extremities:  No clubbing, cyanosis or edema. Neuro:   Grossly intact. No focal deficits appreciated. Responsive to questions.  Diagnostic studies:    Allergy Studies:  Indoor/Outdoor Percutaneous Adult Environmental Panel: negative to the entire panel with adequate controls.  Indoor/Outdoor Selected Intradermal Environmental Panel: positive to Grass mix, weed mix, tree mix, dog, cockroach and mite mix. Otherwise negative with adequate controls.     Salvatore Marvel, MD [facility]

## 2017-12-08 NOTE — Patient Instructions (Addendum)
1. Seasonal and perennial allergic rhinitis - Testing today showed: trees, weeds, grasses, dust mites, dog and cockroach - Avoidancmeasures provided. - Start taking: Zyrtec (cetirizine) 10mg  tablet once daily and Nasacort (triamcinolone) two sprays per nostril daily - You can use an extra dose of the antihistamine, if needed, for breakthrough symptoms.  - Consider nasal saline rinses 1-2 times daily to remove allergens from the nasal cavities as well as help with mucous clearance (this is especially helpful to do before the nasal sprays are given) - Consider allergy shots as a means of long-term control. - Allergy shots "re-train" and "reset" the immune system to ignore environmental allergens and decrease the resulting immune response to those allergens (sneezing, itchy watery eyes, runny nose, nasal congestion, etc).    - Allergy shots improve symptoms in 75-85% of patients.  - We can discuss more at the next appointment if the medications are not working for you.  2. Return in about 3 months (around 03/08/2018).   Please inform us of any Emergency Department visits, hospitalizations, or changes in symptoms. Call us before going to the ED for breathing or allergy symptoms since we might be able to fit you in for a sick visit. Feel free to contact us anytime with any questions, problems, or concerns.  It was a pleasure to meet you today! Enjoy the holiday season!  Websites that have reliable patient information: 1. American Academy of Asthma, Allergy, and Immunology: www.aaaai.org 2. Food Allergy Research and Education (FARE): foodallergy.org 3. Mothers of Asthmatics: http://www.asthmacommunitynetwork.org 4. American College of Allergy, Asthma, and Immunology: www.acaai.org   Reducing Pollen Exposure  The American Academy of Allergy, Asthma and Immunology suggests the following steps to reduce your exposure to pollen during allergy seasons.    1. Do not hang sheets or clothing out to  dry; pollen may collect on these items. 2. Do not mow lawns or spend time around freshly cut grass; mowing stirs up pollen. 3. Keep windows closed at night.  Keep car windows closed while driving. 4. Minimize morning activities outdoors, a time when pollen counts are usually at their highest. 5. Stay indoors as much as possible when pollen counts or humidity is high and on windy days when pollen tends to remain in the air longer. 6. Use air conditioning when possible.  Many air conditioners have filters that trap the pollen spores. 7. Use a HEPA room air filter to remove pollen form the indoor air you breathe.  Control of Dog or Cat Allergen  Avoidance is the best way to manage a dog or cat allergy. If you have a dog or cat and are allergic to dog or cats, consider removing the dog or cat from the home. If you have a dog or cat but don't want to find it a new home, or if your family wants a pet even though someone in the household is allergic, here are some strategies that may help keep symptoms at bay:  1. Keep the pet out of your bedroom and restrict it to only a few rooms. Be advised that keeping the dog or cat in only one room will not limit the allergens to that room. 2. Don't pet, hug or kiss the dog or cat; if you do, wash your hands with soap and water. 3. High-efficiency particulate air (HEPA) cleaners run continuously in a bedroom or living room can reduce allergen levels over time. 4. Regular use of a high-efficiency vacuum cleaner or a central vacuum can reduce allergen levels.  5. Giving your dog or cat a bath at least once a week can reduce airborne allergen.  Control of House Dust Mite Allergen    House dust mites play a major role in allergic asthma and rhinitis.  They occur in environments with high humidity wherever human skin, the food for dust mites is found. High levels have been detected in dust obtained from mattresses, pillows, carpets, upholstered furniture, bed  covers, clothes and soft toys.  The principal allergen of the house dust mite is found in its feces.  A gram of dust may contain 1,000 mites and 250,000 fecal particles.  Mite antigen is easily measured in the air during house cleaning activities.    1. Encase mattresses, including the box spring, and pillow, in an air tight cover.  Seal the zipper end of the encased mattresses with wide adhesive tape. 2. Wash the bedding in water of 130 degrees Farenheit weekly.  Avoid cotton comforters/quilts and flannel bedding: the most ideal bed covering is the dacron comforter. 3. Remove all upholstered furniture from the bedroom. 4. Remove carpets, carpet padding, rugs, and non-washable window drapes from the bedroom.  Wash drapes weekly or use plastic window coverings. 5. Remove all non-washable stuffed toys from the bedroom.  Wash stuffed toys weekly. 6. Have the room cleaned frequently with a vacuum cleaner and a damp dust-mop.  The patient should not be in a room which is being cleaned and should wait 1 hour after cleaning before going into the room. 7. Close and seal all heating outlets in the bedroom.  Otherwise, the room will become filled with dust-laden air.  An electric heater can be used to heat the room. 8. Reduce indoor humidity to less than 50%.  Do not use a humidifier.  Control of Cockroach Allergen  Cockroach allergen has been identified as an important cause of acute attacks of asthma, especially in urban settings.  There are fifty-five species of cockroach that exist in the [place], however only three, the [place], [place] species produce allergen that can affect patients with Asthma.  Allergens can be obtained from fecal particles, egg casings and secretions from cockroaches.    1. Remove food sources. 2. Reduce access to water. 3. Seal access and entry points. 4. Spray runways with 0.5-1% Diazinon or Chlorpyrifos 5. Blow boric acid power under stoves and  refrigerator. 6. Place bait stations (hydramethylnon) at feeding sites.

## 2018-03-09 ENCOUNTER — Ambulatory Visit: Payer: 59 | Admitting: Allergy & Immunology

## 2018-03-29 ENCOUNTER — Ambulatory Visit: Payer: 59 | Admitting: Family Medicine

## 2018-03-29 ENCOUNTER — Encounter: Payer: Self-pay | Admitting: Family Medicine

## 2018-03-29 VITALS — BP 118/70 | HR 79 | Temp 99.3°F | Ht 62.0 in | Wt 190.0 lb

## 2018-03-29 DIAGNOSIS — R011 Cardiac murmur, unspecified: Secondary | ICD-10-CM

## 2018-03-29 DIAGNOSIS — D500 Iron deficiency anemia secondary to blood loss (chronic): Secondary | ICD-10-CM | POA: Diagnosis not present

## 2018-03-29 DIAGNOSIS — Z7689 Persons encountering health services in other specified circumstances: Secondary | ICD-10-CM

## 2018-03-29 NOTE — Progress Notes (Signed)
Patient presents to clinic today to establish care.  SUBJECTIVE: PMH: Pt is a 42 yo female with pmh sig for heart murmur, anemia s/p txf, genital warts, HAs.  Pt was previously seen in [place], [place].  Pt has lived here x 5 yrs.  She does have a local OB/Gyn.  Heart murmur: -pt endorses murmur since a child -thinks she has a VSD -has not been to Cardiology since -takes abx prior to dental work.  Anemia: -pt endorses ongoing h/o -currently taking Iron inconsistently -s/p 3 blood transfusions (2001, 2009, 2014) -does endorse heavy menses and fibroids, but notes improvement since moving to [place].  R knee pain: -pt endorses previous h/o edema 2017 or 2018 -had xrays and steroid injection -h/o running track and doing long jump.  Does not recall any recent injury.  Allergies: ASA- hives  P Surg Hx: D&C Hysteroscopy  Social Hx: Pt is newly married.  Pt has 2 girls, ages 32 and 94.  Pt works as a Education officer, museum at [employer].  Pt is currently in school at [employer].  Pt denies tobacco use or drug use.  Pt does endorse EtOH use.  Family Med Hx: Mom-alive, alcohol abuse, depression, mental illness Dad-alive, drug abuse Brother-Larry, alive Brother-Darrell, alive   Health Maintenance: Dental --Dr. Burnard Bunting [facility]  Mammogram --09/2017 PAP --06/2017 LMP 3/19 - 03/18/18   Past Medical History:  Diagnosis Date  . Anemia   . Blood transfusion without reported diagnosis   . Endometrial polyp    Benign  . Heart murmur   . HSV infection     Past Surgical History:  Procedure Laterality Date  . D&C x 2     Polyp removal 2011 by Dr. Cheri Rous in [place]  . iron infusions    . WISDOM TOOTH EXTRACTION      Current Outpatient Medications on File Prior to Visit  Medication Sig Dispense Refill  . ferrous sulfate 325 (65 FE) MG EC tablet Take 1 tablet (325 mg total) by mouth 3 (three) times daily with meals. 90 tablet 3  . fluticasone (CUTIVATE)  0.05 % cream Apply 1 application topically 2 (two) times daily.     No current facility-administered medications on file prior to visit.     Allergies  Allergen Reactions  . Aspirin Hives    Family History  Problem Relation Age of Onset  . Eczema Mother     Social History   Socioeconomic History  . Marital status: Married    Spouse name: Not on file  . Number of children: Not on file  . Years of education: Not on file  . Highest education level: Not on file  Occupational History  . Not on file  Social Needs  . Financial resource strain: Not on file  . Food insecurity:    Worry: Not on file    Inability: Not on file  . Transportation needs:    Medical: Not on file    Non-medical: Not on file  Tobacco Use  . Smoking status: Never Smoker  . Smokeless tobacco: Never Used  Substance and Sexual Activity  . Alcohol use: Yes    Comment: "socially"  . Drug use: No  . Sexual activity: Never  Lifestyle  . Physical activity:    Days per week: Not on file    Minutes per session: Not on file  . Stress: Not on file  Relationships  . Social connections:    Talks on  phone: Not on file    Gets together: Not on file    Attends religious service: Not on file    Active member of club or organization: Not on file    Attends meetings of clubs or organizations: Not on file    Relationship status: Not on file  . Intimate partner violence:    Fear of current or ex partner: Not on file    Emotionally abused: Not on file    Physically abused: Not on file    Forced sexual activity: Not on file  Other Topics Concern  . Not on file  Social History Narrative   42 yo AAF who lives at home with her stepmom and her 2 daughters ages 83 and 78. She is a never smoker, social alcohol drinker described as 1-2 drinks a month, denies illicit drug use. She has some college education and works as a Loss adjuster, chartered at [employer]. She recently moved from [place] to [place] 4  months ago and has no PCP or OB/GYN established.    ROS General: Denies fever, chills, night sweats, changes in weight, changes in appetite HEENT: Denies headaches, ear pain, changes in vision, rhinorrhea, sore throat CV: Denies CP, palpitations, SOB, orthopnea  +murmur Pulm: Denies SOB, cough, wheezing GI: Denies abdominal pain, nausea, vomiting, diarrhea, constipation GU: Denies dysuria, hematuria, frequency, vaginal discharge Msk: Denies muscle cramps, joint pains Neuro: Denies weakness, numbness, tingling Skin: Denies rashes, bruising Psych: Denies depression, anxiety, hallucinations  BP 118/70 (BP Location: Left Arm, Patient Position: Sitting, Cuff Size: Normal)   Pulse 79   Temp 99.3 F (37.4 C) (Oral)   Ht 5\' 2"  (1.575 m)   Wt 190 lb (86.2 kg)   SpO2 98%   BMI 34.75 kg/m   Physical Exam Gen. Pleasant, well developed, well-nourished, in NAD HEENT - [place]/AT, PERRL, no scleral icterus, no nasal drainage, pharynx without erythema or exudate.  TMs normal bilaterally.  No cervical lymphadenopathy. Lungs: no use of accessory muscles, no dullness to percussion, CTAB, no wheezes, rales or rhonchi Cardiovascular: RRR, 3/6 murmur best heard at LUSB, no peripheral edema Abdomen: BS present, soft, nontender, nondistended Neuro:  A&Ox3, CN II-XII intact, normal gait Skin:  Warm, dry, intact, no lesions.  Tattoo on R neck Psych: normal affect, mood appropriate  No results found for this or any previous visit (from the past 2160 hour(s)).  Assessment/Plan: Heart murmur  - Plan: Ambulatory referral to Cardiology  Iron deficiency anemia due to chronic blood loss -Patient encouraged to continue iron twice daily. -Discussed MiraLAX as needed for constipation -Labs recently done.  We will not repeat at this time  Encounter to establish care .-We reviewed the PMH, PSH, FH, SH, Meds and Allergies. -We provided refills for any medications we will prescribe as needed. -We addressed  current concerns per orders and patient instructions. -We have asked for records for pertinent exams, studies, vaccines and notes from previous providers. -We have advised patient to follow up per instructions below.  Follow-up in the next few months for CPE  Grier Mitts, MD

## 2018-03-29 NOTE — Patient Instructions (Signed)
Anemia Anemia is a condition in which you do not have enough red blood cells or hemoglobin. Hemoglobin is a substance in red blood cells that carries oxygen. When you do not have enough red blood cells or hemoglobin (are anemic), your body cannot get enough oxygen and your organs may not work properly. As a result, you may feel very tired or have other problems. What are the causes? Common causes of anemia include:  Excessive bleeding. Anemia can be caused by excessive bleeding inside or outside the body, including bleeding from the intestine or from periods in women.  Poor nutrition.  Long-lasting (chronic) kidney, thyroid, and liver disease.  Bone marrow disorders.  Cancer and treatments for cancer.  HIV (human immunodeficiency virus) and AIDS (acquired immunodeficiency syndrome).  Treatments for HIV and AIDS.  Spleen problems.  Blood disorders.  Infections, medicines, and autoimmune disorders that destroy red blood cells.  What are the signs or symptoms? Symptoms of this condition include:  Minor weakness.  Dizziness.  Headache.  Feeling heartbeats that are irregular or faster than normal (palpitations).  Shortness of breath, especially with exercise.  Paleness.  Cold sensitivity.  Indigestion.  Nausea.  Difficulty sleeping.  Difficulty concentrating.  Symptoms may occur suddenly or develop slowly. If your anemia is mild, you may not have symptoms. How is this diagnosed? This condition is diagnosed based on:  Blood tests.  Your medical history.  A physical exam.  Bone marrow biopsy.  Your health care provider may also check your stool (feces) for blood and may do additional testing to look for the cause of your bleeding. You may also have other tests, including:  Imaging tests, such as a CT scan or MRI.  Endoscopy.  Colonoscopy.  How is this treated? Treatment for this condition depends on the cause. If you continue to lose a lot of blood,  you may need to be treated at a hospital. Treatment may include:  Taking supplements of iron, vitamin B12, or folic acid.  Taking a hormone medicine (erythropoietin) that can help to stimulate red blood cell growth.  Having a blood transfusion. This may be needed if you lose a lot of blood.  Making changes to your diet.  Having surgery to remove your spleen.  Follow these instructions at home:  Take over-the-counter and prescription medicines only as told by your health care provider.  Take supplements only as told by your health care provider.  Follow any diet instructions that you were given.  Keep all follow-up visits as told by your health care provider. This is important. Contact a health care provider if:  You develop new bleeding anywhere in the body. Get help right away if:  You are very weak.  You are short of breath.  You have pain in your abdomen or chest.  You are dizzy or feel faint.  You have trouble concentrating.  You have bloody or black, tarry stools.  You vomit repeatedly or you vomit up blood. Summary  Anemia is a condition in which you do not have enough red blood cells or enough of a substance in your red blood cells that carries oxygen (hemoglobin).  Symptoms may occur suddenly or develop slowly.  If your anemia is mild, you may not have symptoms.  This condition is diagnosed with blood tests as well as a medical history and physical exam. Other tests may be needed.  Treatment for this condition depends on the cause of the anemia. This information is not intended to replace advice   given to you by your health care provider. Make sure you discuss any questions you have with your health care provider. Document Released: 01/20/2005 Document Revised: 01/14/2017 Document Reviewed: 01/14/2017 Elsevier Interactive Patient Education  2018 [place] A heart murmur is an extra sound that is caused by chaotic blood flow. The murmur  can be heard as a "hum" or "whoosh" sound when blood flows through the heart. The heart has four areas called chambers. Valves separate the upper and lower chambers from each other (tricuspid valve and mitral valve) and separate the lower chambers of the heart from pathways that lead away from the heart (aortic valve and pulmonary valve). Normally, the valves open to let blood flow through or out of your heart, and then they shut to keep the blood from flowing backward. There are two types of heart murmurs:  Innocent murmurs. Most people with this type of heart murmur do not have a heart problem. Many children have innocent heart murmurs. Your health care provider may suggest some basic testing to find out whether your murmur is an innocent murmur. If an innocent heart murmur is found, there is no need for further tests or treatment and no need to restrict activities or stop playing sports.  Abnormal murmurs. These types of murmurs can occur in children and adults. Abnormal murmurs may be a sign of a more serious heart condition, such as a heart defect present at birth (congenital defect) or heart valve disease.  What are the causes? This condition is caused by heart valves that are not working properly. In children, abnormal heart murmurs are typically caused by congenital defects. In adults, abnormal murmurs are usually from heart valve problems caused by disease, infection, or aging. Three types of heart valve defects can cause a murmur:  Regurgitation. This is when blood leaks back through the valve in the wrong direction.  Mitral valve prolapse. This is when the mitral valve of the heart has a loose flap and does not close tightly.  Stenosis. This is when a valve does not open enough and blocks blood flow.  This condition may also be caused by:  Pregnancy.  Fever.  Overactive thyroid gland.  Anemia.  Exercise.  Rapid growth spurts (in children).  What are the signs or  symptoms? Innocent murmurs do not cause symptoms, and many people with abnormal murmurs may or may not have symptoms. If symptoms do develop, they may include:  Shortness of breath.  Blue coloring of the skin, especially on the fingertips.  Chest pain.  Palpitations, or feeling a fluttering or skipped heartbeat.  Fainting.  Persistent cough.  Getting tired much faster than expected.  Swelling in the abdomen, feet, or ankles.  How is this diagnosed? This condition may be diagnosed during a routine physical or other exam. If your health care provider hears a murmur with a stethoscope, he or she will listen for:  Where the murmur is located in your heart.  How long the murmur lasts (duration).  When the murmur is heard during the heartbeat.  How loud the murmur is. This may help the health care provider figure out what is causing the murmur.  You may be referred to a heart specialist (cardiologist). You may also have other tests, including:  Electrocardiogram (ECG or EKG). This test measures the electrical activity of your heart.  Echocardiogram. This test uses high frequency sound waves to make pictures of your heart.  MRI or chest X-ray.  Cardiac catheterization. This  test looks at blood flow through the heart.  For children and adults who have an abnormal heart murmur and want to stay active, it is important to complete testing, review test results, and receive recommendations from your health care provider. If heart disease is present, it may not be safe to play or be active. How is this treated? Heart murmurs themselves do not need treatment. In some cases, a heart murmur may go away on its own. If an underlying problem or disease is causing the murmur, you may need treatment. If treatment is needed, it will depend on the type and severity of the disease or heart problem causing the murmur. Treatment may include:  Medicine.  Surgery.  Dietary and lifestyle  changes.  Follow these instructions at home:  Talk with your health care provider before participating in sports or other activities that require a lot of effort and energy (are strenuous).  Learn as much as possible about your condition and any related diseases. Ask your health care provider if you may at risk for any medical emergencies.  Talk with your health care provider about what symptoms you should look out for.  It is up to you to get your test results. Ask your health care provider, or the department that is doing the test, when your results will be ready.  Keep all follow-up visits as told by your health care provider. This is important. Contact a health care provider if:  You feel light-headed.  You are frequently short of breath.  You feel more tired than usual.  You are having a hard time keeping up with normal activities or fitness routines.  You have swelling in your ankles or feet.  You have chest pain.  You notice that your heart often beats irregularly.  You develop any new symptoms. Get help right away if:  You develop severe chest pain.  You are having trouble breathing.  You have fainting spells.  Your symptoms suddenly get worse. These symptoms may represent a serious problem that is an emergency. Do not wait to see if the symptoms will go away. Get medical help right away. Call your local emergency services (911 in the [place] Do not drive yourself to the hospital. Summary  Normally, the heart valves open to let blood flow through or out of your heart, and then they shut to keep the blood from flowing backward.  Heart murmur is caused by heart valves that are not working properly.  You may need treatment if an underlying problem or disease is causing the heart murmur. Treatment may include medicine, surgery, or dietary and lifestyle changes.  Talk with your health care provider before participating in sports or other activities that require a lot  of effort and energy (are strenuous).  Talk with your health care provider about what symptoms you should watch out for. This information is not intended to replace advice given to you by your health care provider. Make sure you discuss any questions you have with your health care provider. Document Released: 01/20/2005 Document Revised: 12/01/2016 Document Reviewed: 12/01/2016 Elsevier Interactive Patient Education  [employer].

## 2018-04-19 ENCOUNTER — Encounter: Payer: Self-pay | Admitting: Cardiovascular Disease

## 2018-04-28 NOTE — Progress Notes (Signed)
Cardiology Office Note   Date:  05/04/2018   ID:  Emily Myers, DOB [birth year], MRN [id number]  PCP:  Billie Ruddy, MD  Cardiologist:   Jenkins Rouge, MD   No chief complaint on file.     History of Present Illness: Emily Myers is a 42 y.o. female who presents for consultation regarding murmur. Referred by Emily Mitts MD.  Reviewed her note from 03/29/18 She has had a murmur since childhood. She mentions ? VSD. Still taking antibiotics before dental work She has anemia with previous need for transfusion.   Pt is newly married.  Pt has 2 girls, ages 4 and 26.  Pt works as a Education officer, museum at [employer].  Pt is currently in school at [employer].  Pt denies tobacco use or drug use.  Pt does endorse EtOH use.Previously in [place]   She was fairly adament about taking SBE prophylaxis I tried to explain that this was an old recommendation  Her husband was just diagnosed with DM 62 yo daughter has masters still living in [place] yo "driving me crazy" at [employer]  No dyspnea palpitations last had syncope 5 years ago when anemia acting up    Past Medical History:  Diagnosis Date  . Anemia   . Blood transfusion without reported diagnosis   . Endometrial polyp    Benign  . Heart murmur   . HSV infection     Past Surgical History:  Procedure Laterality Date  . D&C x 2     Polyp removal 2011 by Dr. Cheri Rous in [place]  . iron infusions    . WISDOM TOOTH EXTRACTION       Current Outpatient Medications  Medication Sig Dispense Refill  . amoxicillin (AMOXIL) 500 MG capsule Take 500 mg by mouth as directed. Prior and post dental procedures    . cetirizine (ZYRTEC) 10 MG tablet Take 10 mg by mouth as directed.    . ferrous sulfate 325 (65 FE) MG EC tablet Take 1 tablet (325 mg total) by mouth 3 (three) times daily with meals. 90 tablet 3  . fluticasone (CUTIVATE) 0.05 % cream Apply 1 application topically 2 (two) times daily.     . fluticasone (FLONASE) 50 MCG/ACT nasal spray Place into both nostrils as directed.     No current facility-administered medications for this visit.     Allergies:   Aspirin    Social History:  The patient  reports that she has never smoked. She has never used smokeless tobacco. She reports that she drinks alcohol. She reports that she does not use drugs.   Family History:  The patient's family history includes Eczema in her mother.    ROS:  Please see the history of present illness.   Otherwise, review of systems are positive for none.   All other systems are reviewed and negative.    PHYSICAL EXAM: VS:  BP 122/64   Pulse 62   Ht 5\' 2"  (1.575 m)   Wt 190 lb (86.2 kg)   SpO2 99%   BMI 34.75 kg/m  , BMI Body mass index is 34.75 kg/m. Affect appropriate Healthy:  appears stated age 42: normal Neck supple with no adenopathy JVP normal no bruits no thyromegaly Lungs clear with no wheezing and good diaphragmatic motion Heart:  S1/S2 2nd heart sound split SEM most prominent pulmonary area  murmur, no rub, gallop or click PMI normal Abdomen: benighn, BS positve, no tenderness, no AAA no  bruit.  No HSM or HJR Distal pulses intact with no bruits No edema Neuro non-focal Skin warm and dry No muscular weakness    EKG:  12/04/13 SB rate 58 normal  05/04/18  SR rate 57 normal    Recent Labs: No results found for requested labs within last 8760 hours.    Lipid Panel No results found for: CHOL, TRIG, HDL, CHOLHDL, VLDL, LDLCALC, LDLDIRECT    Wt Readings from Last 3 Encounters:  05/04/18 190 lb (86.2 kg)  03/29/18 190 lb (86.2 kg)  12/08/17 186 lb (84.4 kg)      Other studies Reviewed: Additional studies/ records that were reviewed today include: notes from primary and labs.    ASSESSMENT AND PLAN:  1.  Murmur:  Does not sound like VSD unless not restrictive ? Pulmonary will get TTE Does not need SBE 2. Anemia stable last 5 years related to heavy menses which  has improved over time   does not like to take iron    Current medicines are reviewed at length with the patient today.  The patient does not have concerns regarding medicines.  The following changes have been made:  no change  Labs/ tests ordered today include: TTE  Orders Placed This Encounter  Procedures  . EKG 12-Lead  . ECHOCARDIOGRAM COMPLETE     Disposition:   FU with cardiology in a year      Signed, Jenkins Rouge, MD  05/04/2018 8:38 AM    [address], [place], [place]  [address] Phone: [phone]; Fax: [phone]

## 2018-05-04 ENCOUNTER — Ambulatory Visit: Payer: 59 | Admitting: Cardiovascular Disease

## 2018-05-04 ENCOUNTER — Encounter: Payer: Self-pay | Admitting: Cardiovascular Disease

## 2018-05-04 VITALS — BP 122/64 | HR 62 | Ht 62.0 in | Wt 190.0 lb

## 2018-05-04 DIAGNOSIS — R011 Cardiac murmur, unspecified: Secondary | ICD-10-CM

## 2018-05-04 NOTE — Patient Instructions (Addendum)

## 2018-06-26 DIAGNOSIS — Z5189 Encounter for other specified aftercare: Secondary | ICD-10-CM

## 2018-06-26 HISTORY — DX: Encounter for other specified aftercare: Z51.89

## 2018-07-04 ENCOUNTER — Ambulatory Visit: Payer: Self-pay | Admitting: *Deleted

## 2018-07-04 ENCOUNTER — Encounter (HOSPITAL_COMMUNITY): Payer: Self-pay | Admitting: Emergency Medicine

## 2018-07-04 ENCOUNTER — Observation Stay (HOSPITAL_COMMUNITY)
Admission: EM | Admit: 2018-07-04 | Discharge: 2018-07-05 | Disposition: A | Discharge status: Home / Self Care | Payer: 59 | Attending: Family Medicine | Admitting: Family Medicine

## 2018-07-04 ENCOUNTER — Other Ambulatory Visit: Payer: Self-pay

## 2018-07-04 ENCOUNTER — Emergency Department (HOSPITAL_COMMUNITY): Payer: 59

## 2018-07-04 DIAGNOSIS — N920 Excessive and frequent menstruation with regular cycle: Secondary | ICD-10-CM | POA: Diagnosis not present

## 2018-07-04 DIAGNOSIS — D509 Iron deficiency anemia, unspecified: Secondary | ICD-10-CM | POA: Diagnosis not present

## 2018-07-04 DIAGNOSIS — N840 Polyp of corpus uteri: Secondary | ICD-10-CM | POA: Diagnosis not present

## 2018-07-04 DIAGNOSIS — K5900 Constipation, unspecified: Secondary | ICD-10-CM | POA: Diagnosis not present

## 2018-07-04 DIAGNOSIS — N83202 Unspecified ovarian cyst, left side: Secondary | ICD-10-CM | POA: Insufficient documentation

## 2018-07-04 DIAGNOSIS — D649 Anemia, unspecified: Secondary | ICD-10-CM | POA: Diagnosis not present

## 2018-07-04 DIAGNOSIS — D259 Leiomyoma of uterus, unspecified: Principal | ICD-10-CM | POA: Insufficient documentation

## 2018-07-04 DIAGNOSIS — Z886 Allergy status to analgesic agent status: Secondary | ICD-10-CM | POA: Insufficient documentation

## 2018-07-04 DIAGNOSIS — R5382 Chronic fatigue, unspecified: Secondary | ICD-10-CM | POA: Diagnosis not present

## 2018-07-04 DIAGNOSIS — R102 Pelvic and perineal pain: Secondary | ICD-10-CM | POA: Diagnosis not present

## 2018-07-04 DIAGNOSIS — N83209 Unspecified ovarian cyst, unspecified side: Secondary | ICD-10-CM | POA: Diagnosis present

## 2018-07-04 LAB — COMPREHENSIVE METABOLIC PANEL
ALBUMIN: 4.1 g/dL (ref 3.5–5.0)
ALK PHOS: 54 U/L (ref 38–126)
ALT: 12 U/L (ref 0–44)
AST: 16 U/L (ref 15–41)
Anion gap: 9 (ref 5–15)
BUN: 11 mg/dL (ref 6–20)
CALCIUM: 9 mg/dL (ref 8.9–10.3)
CO2: 21 mmol/L — ABNORMAL LOW (ref 22–32)
Chloride: 107 mmol/L (ref 98–111)
Creatinine, Ser: 0.68 mg/dL (ref 0.44–1.00)
GFR calc Af Amer: >60 mL/min (ref >60)
GFR calc non Af Amer: >60 mL/min (ref >60)
GLUCOSE: 94 mg/dL (ref 70–99)
Potassium: 3.9 mmol/L (ref 3.5–5.1)
Sodium: 137 mmol/L (ref 135–145)
TOTAL PROTEIN: 7.2 g/dL (ref 6.5–8.1)
Total Bilirubin: 0.6 mg/dL (ref 0.3–1.2)

## 2018-07-04 LAB — WET PREP, GENITAL
Clue Cells Wet Prep HPF POC: NONE SEEN
SPERM: NONE SEEN
TRICH WET PREP: NONE SEEN
Yeast Wet Prep HPF POC: NONE SEEN

## 2018-07-04 LAB — FOLATE: Folate: 20.9 ng/mL (ref >5.9)

## 2018-07-04 LAB — CBC
HCT: 24.6 % — ABNORMAL LOW (ref 36.0–46.0)
Hemoglobin: 6.5 g/dL — CL (ref 12.0–15.0)
MCH: 16.2 pg — ABNORMAL LOW (ref 26.0–34.0)
MCHC: 26.4 g/dL — ABNORMAL LOW (ref 30.0–36.0)
MCV: 61.2 fL — ABNORMAL LOW (ref 78.0–100.0)
Platelets: 160 10*3/uL (ref 150–400)
RBC: 4.02 MIL/uL (ref 3.87–5.11)
RDW: 24.7 % — AB (ref 11.5–15.5)
WBC: 8.8 10*3/uL (ref 4.0–10.5)

## 2018-07-04 LAB — PREPARE RBC (CROSSMATCH)

## 2018-07-04 LAB — IRON AND TIBC
Iron: 57 ug/dL (ref 28–170)
SATURATION RATIOS: 9 % — AB (ref 10.4–31.8)
TIBC: 657 ug/dL — AB (ref 250–450)
UIBC: 600 ug/dL

## 2018-07-04 LAB — URINALYSIS, ROUTINE W REFLEX MICROSCOPIC
BILIRUBIN URINE: NEGATIVE
Glucose, UA: NEGATIVE mg/dL
HGB URINE DIPSTICK: NEGATIVE
KETONES UR: NEGATIVE mg/dL
Leukocytes, UA: NEGATIVE
NITRITE: NEGATIVE
Protein, ur: NEGATIVE mg/dL
Specific Gravity, Urine: 1.019 (ref 1.005–1.030)
pH: 7 (ref 5.0–8.0)

## 2018-07-04 LAB — RETICULOCYTES
RBC.: 3.91 MIL/uL (ref 3.87–5.11)
RETIC CT PCT: 1.3 % (ref 0.4–3.1)
Retic Count, Absolute: 50.8 10*3/uL (ref 19.0–186.0)

## 2018-07-04 LAB — FERRITIN: FERRITIN: 3 ng/mL — AB (ref 11–307)

## 2018-07-04 LAB — LIPASE, BLOOD: Lipase: 26 U/L (ref 11–51)

## 2018-07-04 LAB — VITAMIN B12: VITAMIN B 12: 389 pg/mL (ref 180–914)

## 2018-07-04 MED ORDER — ACETAMINOPHEN 325 MG PO TABS: 650.0000 mg | ORAL_TABLET | Freq: Four times a day (QID) | ORAL | Status: DC | PRN

## 2018-07-04 MED ORDER — HYDROCODONE-ACETAMINOPHEN 5-325 MG PO TABS: 1.0000 | ORAL_TABLET | Freq: Four times a day (QID) | ORAL | Status: DC | PRN

## 2018-07-04 MED ORDER — SODIUM CHLORIDE 0.9% FLUSH
3.0000 mL | Freq: Two times a day (BID) | INTRAVENOUS | Status: DC
Start: 2018-07-04 — End: 2018-07-05
  Administered 2018-07-04 – 2018-07-05 (×3): 3 mL via INTRAVENOUS

## 2018-07-04 MED ORDER — DIPHENHYDRAMINE HCL 50 MG/ML IJ SOLN
25.0000 mg | Freq: Four times a day (QID) | INTRAMUSCULAR | Status: DC | PRN
Start: 2018-07-04 — End: 2018-07-05
  Administered 2018-07-04: 25 mg via INTRAVENOUS
  Filled 2018-07-04: qty 1

## 2018-07-04 MED ORDER — SODIUM CHLORIDE 0.9 % IV SOLN
10.0000 mL/h | Freq: Once | INTRAVENOUS | Status: AC
  Administered 2018-07-04: 10 mL/h via INTRAVENOUS

## 2018-07-04 MED ORDER — ACETAMINOPHEN 650 MG RE SUPP: 650.0000 mg | Freq: Four times a day (QID) | RECTAL | Status: DC | PRN

## 2018-07-04 MED ORDER — ONDANSETRON HCL 4 MG PO TABS: 4.0000 mg | ORAL_TABLET | Freq: Four times a day (QID) | ORAL | Status: DC | PRN

## 2018-07-04 MED ORDER — SENNOSIDES-DOCUSATE SODIUM 8.6-50 MG PO TABS: 1.0000 | ORAL_TABLET | Freq: Every evening | ORAL | Status: DC | PRN

## 2018-07-04 MED ORDER — HYDROCODONE-ACETAMINOPHEN 5-325 MG PO TABS
1.0000 | ORAL_TABLET | Freq: Once | ORAL | Status: AC
  Administered 2018-07-04: 1 via ORAL
  Filled 2018-07-04: qty 1

## 2018-07-04 MED ORDER — ONDANSETRON HCL 4 MG/2ML IJ SOLN: 4.0000 mg | Freq: Four times a day (QID) | INTRAMUSCULAR | Status: DC | PRN

## 2018-07-04 NOTE — Progress Notes (Signed)
Emily Myers [id number]  Code Status: FULL Admission Data: 07/04/2018 5:20 PM  Attending Provider: Tamala Julian UNG:BMBOM, Langley Adie, MD  Consults/ Treatment Team:   [name] is a 42 y.o. female patient admitted from ED awake, alert - oriented X 4 - no acute distress noted. VSS - Blood pressure (!) 141/76, pulse 70, temperature 98.4 F (36.9 C), temperature source Oral, resp. rate 18, height 5\' 2"  (1.575 m), weight 85.2 kg (187 lb 13.3 oz), last menstrual period 06/14/2018, SpO2 100 %. no c/o shortness of breath, no c/o chest pain. Orientation to room, and floor completed with information packet given to patient/family. Admission INP armband ID verified with patient, and in place.  SR up x 2, fall assessment complete, with patient and family able to verbalize understanding of risk associated with falls, and verbalized understanding to call nsg before up out of bed. Call light within reach, patient able to voice, and demonstrate understanding. Skin, clean-dry- intact without evidence of bruising, or skin tears.  No evidence of skin break down noted on exam.  ?  Will cont to eval and treat per MD orders.  Emily Florida, RN  07/04/2018 5:20 PM

## 2018-07-04 NOTE — H&P (Signed)
History and Physical    Emily Myers HEN:[id number] DOB: Jul 15, 1976 DOA: 07/04/2018  Referring MD/NP/PA: Carolin Coy, PA-C PCP: Billie Ruddy, MD  Patient coming from:  home  Chief Complaint: abdominal pain  I have personally briefly reviewed patient's old medical records in [place]   HPI: Emily Myers is a 42 y.o. female with medical history significant of uterine fibroids, menorrhagia, and iron deficiency anemia; who presents with complaints of lower abdominal pain over the last 2 days.  She describes it as a severe crampy feeling that does not radiate.  She initially thought symptoms were secondary to constipation and took an omega 7, and had a bowel movement this morning.  Associated symptoms included lightheadedness, constipation, and chronic fatigue.  Denies any loss of consciousness, chest pain, palpitations, leg swelling, shortness of breath, cough, diarrhea, blood in stools, dysuria, nausea, or vomiting. Patient reports normally having regular heavy menstrual cycles every 23 days, but her last was on 6/19.  Menstrual period usually last approximately 5 days, and she reports changing pads multiple times per day.  Patient reports previously being advised to take iron pills, but states that she does not regularly take them due to taste and constipation.  She follows with OB/GYN and it was previously discussed the options of surgery to remove fibroids.  ED Course: Upon admission into the emergency department patient was noted to have stable vital signs.  Lab work revealed hemoglobin of 6.5.  Urinalysis was negative for signs of infection.  Investigation of patient's abdominal pain included a transvaginal pelvic ultrasound was obtained which revealed multiple uterine fibroids and a 3 cm cyst, but no signs of torsion.  Patient was typed and screened and ordered 2 units of packed red blood cells.  TRH called to admit for observation.  Review of Systems    Constitutional: Positive for malaise/fatigue. Negative for chills and fever.  HENT: Negative for ear discharge and ear pain.   Eyes: Negative for photophobia and pain.  Respiratory: Negative for cough and shortness of breath.   Cardiovascular: Negative for chest pain, claudication and leg swelling.  Gastrointestinal: Positive for abdominal pain and constipation. Negative for diarrhea, nausea and vomiting.  Genitourinary: Negative for dysuria and flank pain.  Musculoskeletal: Negative for falls and neck pain.  Skin: Negative for rash.  Neurological: Positive for dizziness. Negative for loss of consciousness.  Endo/Heme/Allergies: Negative for polydipsia.  Psychiatric/Behavioral: Negative for substance abuse and suicidal ideas.    Past Medical History:  Diagnosis Date  . Anemia   . Blood transfusion without reported diagnosis   . Endometrial polyp    Benign  . Heart murmur   . HSV infection     Past Surgical History:  Procedure Laterality Date  . D&C x 2     Polyp removal 2011 by Dr. Cheri Rous in [place]  . iron infusions    . WISDOM TOOTH EXTRACTION       reports that she has never smoked. She has never used smokeless tobacco. She reports that she drinks alcohol. She reports that she does not use drugs.  Allergies  Allergen Reactions  . Aspirin Hives    Family History  Problem Relation Age of Onset  . Eczema Mother     Prior to Admission medications   Medication Sig Start Date End Date Taking? Authorizing Provider  Multiple Vitamins-Minerals (MULTIVITAMIN WITH MINERALS) tablet Take 1 tablet by mouth daily.   Yes [provider]    Physical Exam:  Constitutional: NAD,  calm, comfortable Vitals:   07/04/18 1300 07/04/18 1315 07/04/18 1330 07/04/18 1444  BP: 134/74 (!) 144/75 (!) 162/72 120/61  Pulse: 63 67 80 60  Resp: 18 14 18 14   Temp:      TempSrc:      SpO2: 100% 100% 100% 100%   Eyes: PERRL, lids and conjunctivae normal ENMT: Mucous membranes  are moist. Posterior pharynx clear of any exudate or lesions.Normal dentition.  Neck: normal, supple, no masses, no thyromegaly Respiratory: clear to auscultation bilaterally, no wheezing, no crackles. Normal respiratory effort. No accessory muscle use.  Cardiovascular: Regular rate and rhythm, no murmurs / rubs / gallops. No extremity edema. 2+ pedal pulses. No carotid bruits.  Abdomen: Mild pelvic tenderness present, no masses palpated. No hepatosplenomegaly. Bowel sounds positive.  Musculoskeletal: no clubbing / cyanosis. No joint deformity upper and lower extremities. Good ROM, no contractures. Normal muscle tone.  Skin: no rashes, lesions, ulcers. No induration Neurologic: CN 2-12 grossly intact. Sensation intact, DTR normal. Strength 5/5 in all 4.  Psychiatric: Normal judgment and insight. Alert and oriented x 3. Normal mood.     Labs on Admission: I have personally reviewed following labs and imaging studies  CBC: Recent Labs  Lab 07/04/18 0952  WBC 8.8  HGB 6.5*  HCT 24.6*  MCV 61.2*  PLT 568   Basic Metabolic Panel: Recent Labs  Lab 07/04/18 0952  NA 137  K 3.9  CL 107  CO2 21*  GLUCOSE 94  BUN 11  CREATININE 0.68  CALCIUM 9.0   GFR: CrCl cannot be calculated (Unknown ideal weight.). Liver Function Tests: Recent Labs  Lab 07/04/18 0952  AST 16  ALT 12  ALKPHOS 54  BILITOT 0.6  PROT 7.2  ALBUMIN 4.1   Recent Labs  Lab 07/04/18 0952  LIPASE 26   No results for input(s): AMMONIA in the last 168 hours. Coagulation Profile: No results for input(s): INR, PROTIME in the last 168 hours. Cardiac Enzymes: No results for input(s): CKTOTAL, CKMB, CKMBINDEX, TROPONINI in the last 168 hours. BNP (last 3 results) No results for input(s): PROBNP in the last 8760 hours. HbA1C: No results for input(s): HGBA1C in the last 72 hours. CBG: No results for input(s): GLUCAP in the last 168 hours. Lipid Profile: No results for input(s): CHOL, HDL, LDLCALC, TRIG,  CHOLHDL, LDLDIRECT in the last 72 hours. Thyroid Function Tests: No results for input(s): TSH, T4TOTAL, FREET4, T3FREE, THYROIDAB in the last 72 hours. Anemia Panel: Recent Labs    07/04/18 1216  VITAMINB12 389  FOLATE 20.9  FERRITIN 3*  TIBC 657*  IRON 57  RETICCTPCT 1.3   Urine analysis:    Component Value Date/Time   COLORURINE YELLOW 07/04/2018 1135   APPEARANCEUR CLEAR 07/04/2018 1135   LABSPEC 1.019 07/04/2018 1135   PHURINE 7.0 07/04/2018 1135   GLUCOSEU NEGATIVE 07/04/2018 1135   HGBUR NEGATIVE 07/04/2018 1135   BILIRUBINUR NEGATIVE 07/04/2018 1135   KETONESUR NEGATIVE 07/04/2018 1135   PROTEINUR NEGATIVE 07/04/2018 1135   UROBILINOGEN 1.0 12/04/2013 0915   NITRITE NEGATIVE 07/04/2018 1135   LEUKOCYTESUR NEGATIVE 07/04/2018 1135   Sepsis Labs: No results found for this or any previous visit (from the past 240 hour(s)).   Radiological Exams on Admission: US Pelvic Complete W Transvaginal And Torsion R/o  Result Date: 07/04/2018 CLINICAL DATA:  Pelvic pain EXAM: TRANSABDOMINAL AND TRANSVAGINAL ULTRASOUND OF PELVIS DOPPLER ULTRASOUND OF OVARIES TECHNIQUE: Both transabdominal and transvaginal ultrasound examinations of the pelvis were performed. Transabdominal technique was performed for global  imaging of the pelvis including uterus, ovaries, adnexal regions, and pelvic cul-de-sac. It was necessary to proceed with endovaginal exam following the transabdominal exam to visualize the ovaries and uterus. COMPARISON:  None. FINDINGS: Measurements: 15.4 x 9.0 x 14.0 cm. Multiple uterine echogenic masses are present compatible with fibroids. Posterior fundal fibroid 4.3 cm x 5.1 cm. Fundal fibroid 3.6 x 3.3 cm. Anterior body fibroid 5.3 x 4.3 cm. Endometrium Thickness: 12.2 mm.  No focal abnormality visualized. Right ovary Measurements: 3.5 x 2.2 x 2.9 cm. Normal appearance/no adnexal mass. Left ovary Measurements: 3.9 x 3.0 x 6.6 cm.  3 cm left ovarian cyst. Pulsed Doppler evaluation  of both ovaries demonstrates normal low-resistance arterial and venous waveforms. Other findings No abnormal free fluid. IMPRESSION: Multiple uterine fibroids. Negative for ovarian torsion.  3 cm left ovarian cyst. Electronically Signed   By: Franchot Gallo M.D.   On: 07/04/2018 14:10      Assessment/Plan Symptomatic anemia, iron deficiency anemia: acute.  Patient presents with complaints of pelvic pain and lightheadedness found to have a hemoglobin of 6.5 on admission.  With low MCV and MCH suggestive of iron deficiency anemia.  Patient reports difficulty taking iron supplements. - Admit to a MedSurg bed - Continue transfusion of 2 units of packed red blood cells - Recheck CBC in a.m - Recommended taking ferrous sulfate versus discuss with PCP regarding iron infusions  Pelvic pain 2/2 left ovarian cyst, uterine fibroids: Acute.  Transvaginal ultrasound showing 3 cm left ovarian cyst unruptured with multiple uterine fibroids. - Hydrocodone as needed pain - Follow-up with outpatient OB/GYN  Menorrhagia: Patient reports having regular menstrual cycles.  Last menstrual cycle on 6/19. - Follow-up  with outpatient OB/GYN    DVT prophylaxis: scd   Code Status: Full Family Communication: Discussed plan of care with patient family present at bedside Disposition Plan: home   Consults called: none  Admission status: observation  Norval Morton MD [facility] Pager [phone]   If 7PM-7AM, please contact night-coverage [email or web address] Password [email or web address]  07/04/2018, 2:45 PM

## 2018-07-04 NOTE — Telephone Encounter (Signed)
Patient currently at [facility] ED 

## 2018-07-04 NOTE — Telephone Encounter (Signed)
Patient is calling with abdominal pain since Sunday- it started Sunday and has not gotten better- she states she has had bowel movement and that did not help. She states the pain is shooting into her legs st times. She rates the pain at a 8 now. Patient is not using birth control and her cycle is due. Advised per protocol to go to ED- she agrees.

## 2018-07-04 NOTE — Telephone Encounter (Signed)
Monitor for ED arrival. 

## 2018-07-04 NOTE — Telephone Encounter (Signed)
  Reason for Disposition . [1] SEVERE pain (e.g., excruciating) AND [2] present > 1 hour  Answer Assessment - Initial Assessment Questions 1. LOCATION: "Where does it hurt?"      Left side and now is start across stomach- lower abdominal pain 2. RADIATION: "Does the pain shoot anywhere else?" (e.g., chest, back)     No- but it is in her legs- bilateral- not at the same time 3. ONSET: "When did the pain begin?" (e.g., minutes, hours or days ago)      Sunday 4. SUDDEN: "Gradual or sudden onset?"     Gradual- like her cycle was trying to start- cramping pain- yesterday more consistant 5. PATTERN "Does the pain come and go, or is it constant?"    - If constant: "Is it getting better, staying the same, or worsening?"      (Note: Constant means the pain never goes away completely; most serious pain is constant and it progresses)     - If intermittent: "How long does it last?" "Do you have pain now?"     (Note: Intermittent means the pain goes away completely between bouts)     constant 6. SEVERITY: "How bad is the pain?"  (e.g., Scale 1-10; mild, moderate, or severe)   - MILD (1-3): doesn't interfere with normal activities, abdomen soft and not tender to touch    - MODERATE (4-7): interferes with normal activities or awakens from sleep, tender to touch    - SEVERE (8-10): excruciating pain, doubled over, unable to do any normal activities      Sharp pain- 8 7. RECURRENT SYMPTOM: "Have you ever had this type of abdominal pain before?" If so, ask: "When was the last time?" and "What happened that time?"      no 8. CAUSE: "What do you think is causing the abdominal pain?"     Patient has ruled out constipation 9. RELIEVING/AGGRAVATING FACTORS: "What makes it better or worse?" (e.g., movement, antacids, bowel movement)     Nothing makes it better 10. OTHER SYMPTOMS: "Has there been any vomiting, diarrhea, constipation, or urine problems?"       no 11. PREGNANCY: "Is there any chance you are  pregnant?" "When was your last menstrual period?"       No birth control- LMP due  Protocols used: [place]

## 2018-07-04 NOTE — ED Notes (Signed)
Acuity level changed d/t hgb 6.5

## 2018-07-04 NOTE — ED Provider Notes (Addendum)
[facility]ERGENCY DEPARTMENT Provider Note   CSN: [id number] Arrival date & time: 07/04/18  0941     History   Chief Complaint Chief Complaint  Patient presents with  . Abdominal Pain    HPI Emily Myers is a 42 y.o. female.  HPI Emily Myers is a 42 y.o. female with history of anemia, prior blood transfusions, uterine fibroids, presents to ED with complaint of lower abdominal pain.  Patient states she has had left lower abdominal pain for 2 days.  States it feels like cramps only worse. Pain does not radiate.  Denies any vaginal bleeding.  Last menstrual cycle was 06/14/2018.  She does have history of anemia and heavy vaginal bleeding due to fibroids.  She denies history of similar pain in the past.  She states she might of been constipated and took some laxatives and had a large bowel movement but it did not help.  She denies any fever or chills.  No urinary symptoms.  Took over-the-counter medications which did not help.  Past Medical History:  Diagnosis Date  . Anemia   . Blood transfusion without reported diagnosis   . Endometrial polyp    Benign  . Heart murmur   . HSV infection     Patient Active Problem List   Diagnosis Date Noted  . Seasonal and perennial allergic rhinitis 12/08/2017  . Heart murmur 12/09/2013  . Acute blood loss anemia 12/04/2013  . Menorrhagia 12/04/2013    Past Surgical History:  Procedure Laterality Date  . D&C x 2     Polyp removal 2011 by Dr. Cheri Rous in [place]  . iron infusions    . WISDOM TOOTH EXTRACTION       OB History   None      Home Medications    Prior to Admission medications   Medication Sig Start Date End Date Taking? Authorizing Provider  Multiple Vitamins-Minerals (MULTIVITAMIN WITH MINERALS) tablet Take 1 tablet by mouth daily.   Yes [provider]    Family History Family History  Problem Relation Age of Onset  . Eczema Mother     Social  History Social History   Tobacco Use  . Smoking status: Never Smoker  . Smokeless tobacco: Never Used  Substance Use Topics  . Alcohol use: Yes    Comment: "socially"  . Drug use: No     Allergies   Aspirin   Review of Systems Review of Systems  Constitutional: Negative for chills and fever.  Respiratory: Negative for cough, chest tightness and shortness of breath.   Cardiovascular: Negative for chest pain, palpitations and leg swelling.  Gastrointestinal: Positive for abdominal pain. Negative for diarrhea, nausea and vomiting.  Genitourinary: Positive for pelvic pain. Negative for dysuria, flank pain, vaginal bleeding, vaginal discharge and vaginal pain.  Musculoskeletal: Negative for arthralgias, myalgias, neck pain and neck stiffness.  Skin: Negative for rash.  Neurological: Negative for dizziness, weakness and headaches.  All other systems reviewed and are negative.    Physical Exam Updated Vital Signs BP (!) 162/72   Pulse 80   Temp 99 F (37.2 C) (Oral)   Resp 18   LMP 06/14/2018   SpO2 100%   Physical Exam  Constitutional: She appears well-developed and well-nourished. No distress.  HENT:  Head: Normocephalic.  Eyes: Conjunctivae are normal.  Neck: Neck supple.  Cardiovascular: Normal rate, regular rhythm and normal heart sounds.  Pulmonary/Chest: Effort normal and breath sounds normal. No respiratory distress. She has no  wheezes. She has no rales.  Abdominal: Soft. Bowel sounds are normal. She exhibits no distension. There is tenderness. There is no rebound.  Left lower quadrant tenderness  Genitourinary:  Genitourinary Comments: Normal external genitalia. Normal vaginal canal. Small thin white discharge. Cervix is normal, closed. No CMT. No uterine or adnexal tenderness. No masses palpated.    Musculoskeletal: She exhibits no edema.  Neurological: She is alert.  Skin: Skin is warm and dry. There is pallor.  Psychiatric: She has a normal mood and  affect. Her behavior is normal.  Nursing note and vitals reviewed.    ED Treatments / Results  Labs (all labs ordered are listed, but only abnormal results are displayed) Labs Reviewed  COMPREHENSIVE METABOLIC PANEL - Abnormal; Notable for the following components:      Result Value   CO2 21 (*)    All other components within normal limits  CBC - Abnormal; Notable for the following components:   Hemoglobin 6.5 (*)    HCT 24.6 (*)    MCV 61.2 (*)    MCH 16.2 (*)    MCHC 26.4 (*)    RDW 24.7 (*)    All other components within normal limits  IRON AND TIBC - Abnormal; Notable for the following components:   TIBC 657 (*)    Saturation Ratios 9 (*)    All other components within normal limits  FERRITIN - Abnormal; Notable for the following components:   Ferritin 3 (*)    All other components within normal limits  WET PREP, GENITAL  LIPASE, BLOOD  URINALYSIS, ROUTINE W REFLEX MICROSCOPIC  VITAMIN B12  FOLATE  RETICULOCYTES  I-STAT BETA HCG BLOOD, ED ([facility], [facility], [facility] ONLY)  TYPE AND SCREEN  PREPARE RBC (CROSSMATCH)  GC/CHLAMYDIA PROBE AMP ([place]) NOT AT [facility]    EKG None  Radiology US Pelvic Complete W Transvaginal And Torsion R/o  Result Date: 07/04/2018 CLINICAL DATA:  Pelvic pain EXAM: TRANSABDOMINAL AND TRANSVAGINAL ULTRASOUND OF PELVIS DOPPLER ULTRASOUND OF OVARIES TECHNIQUE: Both transabdominal and transvaginal ultrasound examinations of the pelvis were performed. Transabdominal technique was performed for global imaging of the pelvis including uterus, ovaries, adnexal regions, and pelvic cul-de-sac. It was necessary to proceed with endovaginal exam following the transabdominal exam to visualize the ovaries and uterus. COMPARISON:  None. FINDINGS: Measurements: 15.4 x 9.0 x 14.0 cm. Multiple uterine echogenic masses are present compatible with fibroids. Posterior fundal fibroid 4.3 cm x 5.1 cm. Fundal fibroid 3.6 x 3.3 cm. Anterior body fibroid 5.3 x 4.3 cm. Endometrium  Thickness: 12.2 mm.  No focal abnormality visualized. Right ovary Measurements: 3.5 x 2.2 x 2.9 cm. Normal appearance/no adnexal mass. Left ovary Measurements: 3.9 x 3.0 x 6.6 cm.  3 cm left ovarian cyst. Pulsed Doppler evaluation of both ovaries demonstrates normal low-resistance arterial and venous waveforms. Other findings No abnormal free fluid. IMPRESSION: Multiple uterine fibroids. Negative for ovarian torsion.  3 cm left ovarian cyst. Electronically Signed   By: Franchot Gallo M.D.   On: 07/04/2018 14:10    Procedures .Critical Care Performed by: Jeannett Senior, PA-C Authorized by: Jeannett Senior, PA-C   Critical care provider statement:    Critical care time (minutes):  30   Critical care was necessary to treat or prevent imminent or life-threatening deterioration of the following conditions: anemia, blood transfusion.   Critical care was time spent personally by me on the following activities:  Discussions with consultants, evaluation of patient's response to treatment, examination of patient, ordering and performing  treatments and interventions, ordering and review of laboratory studies, ordering and review of radiographic studies, pulse oximetry, re-evaluation of patient's condition, obtaining history from patient or surrogate and review of old charts   I assumed direction of critical care for this patient from another provider in my specialty: no     (including critical care time)   Medications Ordered in ED Medications  HYDROcodone-acetaminophen (NORCO/VICODIN) 5-325 MG per tablet 1 tablet (1 tablet Oral Given 07/04/18 1357)  0.9 %  sodium chloride infusion (10 mL/hr Intravenous New Bag/Given 07/04/18 1356)     Initial Impression / Assessment and Plan / ED Course  I have reviewed the triage vital signs and the nursing notes.  Pertinent labs & imaging results that were available during my care of the patient were reviewed by me and considered in my medical decision making  (see chart for details).     Patient in emergency department with lower abdominal pain, mainly on the left side but radiates all over the pelvic area.  Symptoms for 2 days.  She does have history of heavy menstrual cycles and iron deficiency anemia and not taking her iron supplements at this time.  She has not had any blood work checked in a while.   Today's labs show hemoglobin of 6.5.  She has low MCV, MCH and MCHC, consistent with microcytic anemia.  I will send anemia panel and ordered 2 units of blood transfusion.  Currently no bleeding.  Ultrasound pending   Ultrasound shows left ovarian cyst, no signs of torsion, multiple uterine fibroids noted.  Abdomen reassessed, pain much improved.  There is no guarding or rebound tenderness.  I do not think she needs any further imaging at this time regarding her pain.  Will admit for transfusion.  Vital signs are normal, patient is nontoxic.  Vitals:   07/04/18 1445 07/04/18 1500 07/04/18 1515 07/04/18 1530  BP:  130/66  120/61  Pulse: 60 64 60 (!) 58  Resp: 15 13 15 17   Temp:      TempSrc:      SpO2: 100% 100% 100% 100%      Final Clinical Impressions(s) / ED Diagnoses   Final diagnoses:  Pelvic pain  Iron deficiency anemia, unspecified iron deficiency anemia type  Cyst of left ovary    ED Discharge Orders    None       Jeannett Senior, PA-C 07/04/18 1550    Sherwood Gambler, MD 07/06/18 0031    Jeannett Senior, PA-C 07/26/18 1422    Sherwood Gambler, MD 07/26/18 1427

## 2018-07-04 NOTE — ED Notes (Signed)
Hospitalist at the bedside 

## 2018-07-04 NOTE — ED Triage Notes (Signed)
Pt. Stated, Ive had stomach pain since Sunday. iTS MORE LIKE CRAMPS.

## 2018-07-04 NOTE — ED Notes (Signed)
Pt given sandwich and graham crackers with peanut butter.

## 2018-07-05 ENCOUNTER — Other Ambulatory Visit: Payer: Self-pay

## 2018-07-05 ENCOUNTER — Encounter (HOSPITAL_COMMUNITY): Payer: Self-pay | Admitting: General Practice

## 2018-07-05 DIAGNOSIS — N920 Excessive and frequent menstruation with regular cycle: Secondary | ICD-10-CM | POA: Diagnosis not present

## 2018-07-05 DIAGNOSIS — N83202 Unspecified ovarian cyst, left side: Secondary | ICD-10-CM

## 2018-07-05 DIAGNOSIS — D649 Anemia, unspecified: Secondary | ICD-10-CM | POA: Diagnosis not present

## 2018-07-05 DIAGNOSIS — D259 Leiomyoma of uterus, unspecified: Principal | ICD-10-CM

## 2018-07-05 DIAGNOSIS — R102 Pelvic and perineal pain: Secondary | ICD-10-CM

## 2018-07-05 LAB — TYPE AND SCREEN
ABO/RH(D): O POS
ANTIBODY SCREEN: NEGATIVE
UNIT DIVISION: 0
Unit division: 0

## 2018-07-05 LAB — CBC
HCT: 28.1 % — ABNORMAL LOW (ref 36.0–46.0)
Hemoglobin: 8 g/dL — ABNORMAL LOW (ref 12.0–15.0)
MCH: 19.5 pg — ABNORMAL LOW (ref 26.0–34.0)
MCHC: 28.5 g/dL — ABNORMAL LOW (ref 30.0–36.0)
MCV: 68.5 fL — AB (ref 78.0–100.0)
PLATELETS: 99 10*3/uL — AB (ref 150–400)
RBC: 4.1 MIL/uL (ref 3.87–5.11)
RDW: 30.2 % — ABNORMAL HIGH (ref 11.5–15.5)
WBC: 8.8 10*3/uL (ref 4.0–10.5)

## 2018-07-05 LAB — BPAM RBC
Blood Product Expiration Date: 201908052359
Blood Product Expiration Date: 201908062359
ISSUE DATE / TIME: 201907091700
ISSUE DATE / TIME: 201907091934
UNIT TYPE AND RH: 5100
Unit Type and Rh: 5100

## 2018-07-05 LAB — GC/CHLAMYDIA PROBE AMP (~~LOC~~) NOT AT ARMC
Chlamydia: NEGATIVE
Neisseria Gonorrhea: NEGATIVE

## 2018-07-05 LAB — HIV ANTIBODY (ROUTINE TESTING W REFLEX): HIV SCREEN 4TH GENERATION: NONREACTIVE

## 2018-07-05 MED ORDER — SODIUM CHLORIDE 0.9 % IV SOLN
510.0000 mg | Freq: Once | INTRAVENOUS | Status: AC
  Administered 2018-07-05: 510 mg via INTRAVENOUS
  Filled 2018-07-05: qty 17

## 2018-07-05 MED ORDER — FERROUS SULFATE 325 (65 FE) MG PO TABS: 325.0000 mg | ORAL_TABLET | Freq: Every day | ORAL | 0 refills | Status: DC

## 2018-07-05 NOTE — Progress Notes (Signed)
Patient's IV's removed for discharge and intact. Discharge instructions reviewed with patient by Delcine, RN. Volunteer services called to take patient out via wheelchair.

## 2018-07-05 NOTE — Discharge Summary (Signed)
Physician Discharge Summary  Emily Myers HUT:[id number] DOB: 11-Sep-1976 DOA: 07/04/2018  PCP: Billie Ruddy, MD  Admit date: 07/04/2018 Discharge date: 07/05/2018  Admitted From: Home Disposition: Home   Recommendations for Outpatient Follow-up:  1. Follow up with OB/GYN as scheduled.  2. Recheck CBC in the next week 3. Consider recheck iron studies. Given IV iron prior to discharge and started on oral iron.   Home Health: None Equipment/Devices: None Discharge Condition: Stable CODE STATUS: Full Diet recommendation: As tolerated  Brief/Interim Summary: Emily Myers is a 42 y.o. female with a history of uterine fibroids and menorrhagia with iron deficiency anemia not previously tolerant of, or adherent to, iron supplementation who presented with crampy lower abdominal pain. Secondarily she reported lightheadedness and worsening chronic fatigue. LMP 6/19, her usually heavy flow for 5 days. Vital signs were stable on arrival, hemoglobin down to 6.5. Investigation of patient's abdominal pain included a transvaginal pelvic ultrasound was obtained which revealed multiple uterine fibroids and a 3 cm cyst, but no signs of torsion. Ferritin was noted to be 3. 2u PRBCs were given with improvement in hemoglobin to 8.0 and improvement in symptoms. IV iron was also provided and counseling regarding necessity of oral iron supplementation was provided.   Discharge Diagnoses:  Principal Problem:   Symptomatic anemia Active Problems:   Menorrhagia   Pelvic pain   Uterine fibroid   Ovarian cyst  Symptomatic anemia of chronic blood loss, menorrhagia, iron deficiency: Due to menorrhagia.  - Given her intolerance to oral iron, it may behoove her to discuss with OB/GYN regarding surgical vs. medical management.  - Hgb 8 at discharge s/p 2u PRBCs - Start po iron with daily senna and titrate up as able.   Pelvic pain: Possibly due to fibroids vs. ovarian cyst (3cm seen on TVUS on  left).  - Improved, f/u with OB/GYN  Discharge Instructions Discharge Instructions    Discharge instructions   Complete by:  As directed    You were noted to have a cyst which can cause your pain. Fortunately, this has improved. You were also noted to have severe iron-deficiency anemia, probably from fibroids-induced menorrhagia. After blood transfusions your blood counts are improved and you are stable for discharge.  - You will receive IV iron once prior to discharge to help your body make new blood cells. You should also take iron every morning along with miralax over the counter. If this is tolerable, increase to twice daily.  - Follow up with your OB/GYN as scheduled to discuss further treatment options.  - If your symptoms worsen or you notice uncontrolled bleeding, seek medical attention right away.   Increase activity slowly   Complete by:  As directed      Allergies as of 07/05/2018      Reactions   Aspirin Hives      Medication List    TAKE these medications   ferrous sulfate 325 (65 FE) MG tablet Take 1 tablet (325 mg total) by mouth daily with breakfast. Increase to twice daily as tolerated   multivitamin with minerals tablet Take 1 tablet by mouth daily.      Follow-up Information    Billie Ruddy, MD Follow up.   Specialty:  Family Medicine Contact information: [address] [place] [address] [phone]          Allergies  Allergen Reactions  . Aspirin Hives    Consultations:  None  Procedures/Studies: US Pelvic Complete W Transvaginal And Torsion  R/o  Result Date: 07/04/2018 CLINICAL DATA:  Pelvic pain EXAM: TRANSABDOMINAL AND TRANSVAGINAL ULTRASOUND OF PELVIS DOPPLER ULTRASOUND OF OVARIES TECHNIQUE: Both transabdominal and transvaginal ultrasound examinations of the pelvis were performed. Transabdominal technique was performed for global imaging of the pelvis including uterus, ovaries, adnexal regions, and pelvic cul-de-sac. It  was necessary to proceed with endovaginal exam following the transabdominal exam to visualize the ovaries and uterus. COMPARISON:  None. FINDINGS: Measurements: 15.4 x 9.0 x 14.0 cm. Multiple uterine echogenic masses are present compatible with fibroids. Posterior fundal fibroid 4.3 cm x 5.1 cm. Fundal fibroid 3.6 x 3.3 cm. Anterior body fibroid 5.3 x 4.3 cm. Endometrium Thickness: 12.2 mm.  No focal abnormality visualized. Right ovary Measurements: 3.5 x 2.2 x 2.9 cm. Normal appearance/no adnexal mass. Left ovary Measurements: 3.9 x 3.0 x 6.6 cm.  3 cm left ovarian cyst. Pulsed Doppler evaluation of both ovaries demonstrates normal low-resistance arterial and venous waveforms. Other findings No abnormal free fluid. IMPRESSION: Multiple uterine fibroids. Negative for ovarian torsion.  3 cm left ovarian cyst. Electronically Signed   By: Franchot Gallo M.D.   On: 07/04/2018 14:10     Subjective: Feels better. No abdominal pain or lightheadedness. Again denies bleeding.   Discharge Exam: Vitals:   07/04/18 2223 07/05/18 0455  BP: 135/63 116/64  Pulse: 65 63  Resp:  18  Temp: 98.9 F (37.2 C) 98.9 F (37.2 C)  SpO2: 100% 100%   General: Pt is alert, awake, not in acute distress Cardiovascular: RRR, S1/S2 +, no rubs, no gallops Respiratory: CTA bilaterally, no wheezing, no rhonchi Abdominal: Soft, NT, ND, bowel sounds + Extremities: No edema, no cyanosis  Labs: BNP (last 3 results) No results for input(s): BNP in the last 8760 hours. Basic Metabolic Panel: Recent Labs  Lab 07/04/18 0952  NA 137  K 3.9  CL 107  CO2 21*  GLUCOSE 94  BUN 11  CREATININE 0.68  CALCIUM 9.0   Liver Function Tests: Recent Labs  Lab 07/04/18 0952  AST 16  ALT 12  ALKPHOS 54  BILITOT 0.6  PROT 7.2  ALBUMIN 4.1   Recent Labs  Lab 07/04/18 0952  LIPASE 26   No results for input(s): AMMONIA in the last 168 hours. CBC: Recent Labs  Lab 07/04/18 0952 07/05/18 0016  WBC 8.8 8.8  HGB 6.5*  8.0*  HCT 24.6* 28.1*  MCV 61.2* 68.5*  PLT 160 99*   Cardiac Enzymes: No results for input(s): CKTOTAL, CKMB, CKMBINDEX, TROPONINI in the last 168 hours. BNP: Invalid input(s): POCBNP CBG: No results for input(s): GLUCAP in the last 168 hours. D-Dimer No results for input(s): DDIMER in the last 72 hours. Hgb A1c No results for input(s): HGBA1C in the last 72 hours. Lipid Profile No results for input(s): CHOL, HDL, LDLCALC, TRIG, CHOLHDL, LDLDIRECT in the last 72 hours. Thyroid function studies No results for input(s): TSH, T4TOTAL, T3FREE, THYROIDAB in the last 72 hours.  Invalid input(s): FREET3 Anemia work up Recent Labs    07/04/18 1216  VITAMINB12 389  FOLATE 20.9  FERRITIN 3*  TIBC 657*  IRON 57  RETICCTPCT 1.3   Urinalysis    Component Value Date/Time   COLORURINE YELLOW 07/04/2018 [place] 07/04/2018 1135   LABSPEC 1.019 07/04/2018 1135   PHURINE 7.0 07/04/2018 1135   GLUCOSEU NEGATIVE 07/04/2018 1135   HGBUR NEGATIVE 07/04/2018 [place] 07/04/2018 [place] 07/04/2018 1135   PROTEINUR NEGATIVE 07/04/2018 1135  UROBILINOGEN 1.0 12/04/2013 0915   NITRITE NEGATIVE 07/04/2018 1135   LEUKOCYTESUR NEGATIVE 07/04/2018 1135    Microbiology Recent Results (from the past 240 hour(s))  Wet prep, genital     Status: Abnormal   Collection Time: 07/04/18  2:27 PM  Result Value Ref Range Status   Yeast Wet Prep HPF POC NONE SEEN NONE SEEN Final   Trich, Wet Prep NONE SEEN NONE SEEN Final   Clue Cells Wet Prep HPF POC NONE SEEN NONE SEEN Final   WBC, Wet Prep HPF POC MODERATE (A) NONE SEEN Final   Sperm NONE SEEN  Final    Comment: Performed at [facility], [place] [address], [place], [place] [address]    Time coordinating discharge: Approximately 40 minutes  Patrecia Pour, MD  [facility] 07/05/2018, 8:30 AM Pager [phone]

## 2018-07-05 NOTE — Discharge Planning (Signed)
Discharge instructions (including medications) discussed with and copy provided to patient/caregiver, along with care notes for iron sulfate, iron rich diet & anemia  .  Pt. was able to recall 3 important things to do after discharge and where to pick up her medication.

## 2018-07-06 ENCOUNTER — Telehealth: Payer: Self-pay | Admitting: *Deleted

## 2018-07-06 NOTE — Telephone Encounter (Signed)
Transition Care Management Follow-up Telephone Call  Per Discharge Summary: Admit date: 07/04/2018 Discharge date: 07/05/2018  Admitted From: Home Disposition: Home   Recommendations for Outpatient Follow-up:  1. Follow up with OB/GYN as scheduled.  2. Recheck CBC in the next week 3. Consider recheck iron studies. Given IV iron prior to discharge and started on oral iron.   Home Health: None Equipment/Devices: None Discharge Condition: Stable CODE STATUS: Full Diet recommendation: As tolerated  --   How have you been since you were released from the hospital? "I've been okay."   Do you understand why you were in the hospital? yes   Do you understand the discharge instructions? yes   Where were you discharged to? Home   Items Reviewed:  Medications reviewed: yes  Allergies reviewed: yes  Dietary changes reviewed: yes  Referrals reviewed: yes   Functional Questionnaire:   Activities of Daily Living (ADLs):   She states they are independent in the following: ambulation, bathing and hygiene, feeding, continence, grooming, toileting and dressing States they require assistance with the following: none   Any transportation issues/concerns?: yes   Any patient concerns? yes   Confirmed importance and date/time of follow-up visits scheduled yes  Provider Appointment booked with Dr. Grier Mitts 07/13/18 @ 4:30pm  Confirmed with patient if condition begins to worsen call PCP or go to the ER.  Patient was given the office number and encouraged to call back with question or concerns.  : yes

## 2018-07-10 ENCOUNTER — Other Ambulatory Visit: Payer: Self-pay

## 2018-07-10 ENCOUNTER — Ambulatory Visit (HOSPITAL_COMMUNITY): Payer: 59 | Attending: Cardiovascular Disease

## 2018-07-10 ENCOUNTER — Other Ambulatory Visit: Payer: Self-pay | Admitting: Obstetrics and Gynecology

## 2018-07-10 DIAGNOSIS — R011 Cardiac murmur, unspecified: Secondary | ICD-10-CM | POA: Diagnosis not present

## 2018-07-11 ENCOUNTER — Other Ambulatory Visit: Payer: Self-pay | Admitting: Obstetrics and Gynecology

## 2018-07-11 DIAGNOSIS — D649 Anemia, unspecified: Secondary | ICD-10-CM | POA: Diagnosis not present

## 2018-07-11 DIAGNOSIS — N920 Excessive and frequent menstruation with regular cycle: Secondary | ICD-10-CM | POA: Diagnosis not present

## 2018-07-13 ENCOUNTER — Encounter: Payer: Self-pay | Admitting: Family Medicine

## 2018-07-13 ENCOUNTER — Ambulatory Visit (HOSPITAL_COMMUNITY)
Admission: RE | Admit: 2018-07-13 | Discharge: 2018-07-13 | Disposition: A | Discharge status: Home / Self Care | Payer: 59 | Source: Ambulatory Visit | Attending: Obstetrics and Gynecology | Admitting: Obstetrics and Gynecology

## 2018-07-13 ENCOUNTER — Ambulatory Visit: Payer: 59 | Admitting: Family Medicine

## 2018-07-13 VITALS — BP 110/68 | HR 86 | Temp 99.0°F | Wt 189.0 lb

## 2018-07-13 DIAGNOSIS — D509 Iron deficiency anemia, unspecified: Secondary | ICD-10-CM | POA: Insufficient documentation

## 2018-07-13 DIAGNOSIS — Z3201 Encounter for pregnancy test, result positive: Secondary | ICD-10-CM

## 2018-07-13 DIAGNOSIS — N926 Irregular menstruation, unspecified: Secondary | ICD-10-CM | POA: Diagnosis not present

## 2018-07-13 DIAGNOSIS — D259 Leiomyoma of uterus, unspecified: Secondary | ICD-10-CM | POA: Diagnosis not present

## 2018-07-13 DIAGNOSIS — D508 Other iron deficiency anemias: Secondary | ICD-10-CM | POA: Diagnosis not present

## 2018-07-13 LAB — POCT URINE PREGNANCY: Preg Test, Ur: POSITIVE — AB

## 2018-07-13 MED ORDER — SODIUM CHLORIDE 0.9 % IV SOLN
510.0000 mg | Freq: Once | INTRAVENOUS | Status: AC
  Administered 2018-07-13: 510 mg via INTRAVENOUS
  Filled 2018-07-13: qty 17

## 2018-07-13 MED ORDER — SODIUM CHLORIDE 0.9 % IV SOLN
INTRAVENOUS | Status: DC
  Administered 2018-07-13: 11:00:00 via INTRAVENOUS

## 2018-07-13 NOTE — Progress Notes (Signed)
Pt arrived for IV infusion of feraheme; infusion completed with no complications noted  Ordering Provider: Lesle Chris MD Associated Diagnosis: Anemia

## 2018-07-13 NOTE — Progress Notes (Signed)
Subjective:    Patient ID: Emily Myers, female    DOB: 07/30/1976, 42 y.o.   MRN: [id number]  No chief complaint on file.   HPI Patient was seen today for HFU.  Pt was admitted 7/9-7/10 for abd pain and symptomatic anemia.  Pt was transfused 2 units pRBC and given IV iron after hgb was 6.5, ferritin was 3. Transvaginal u/s with multiple uterine fibroids and a 3 cm cyst on L ovary without torsion.   Since d/c pt doing better.  She had IV iron infusion this morning.  Pt states she would like to proceed with myomectomy as she and her husband would like to try for one more child.  Pt states she has an upcoming f/u with Dr. Garwin Brothers in regards to this.  Of note, pt states her period is late.  Pt typically has menses q 23 days, +/- 2 days with heavy flow x 5 days.  LMP was 06/14/18.  Pt has not taken a pregnancy test.  Past Medical History:  Diagnosis Date  . Anemia   . Blood transfusion without reported diagnosis   . Endometrial polyp    Benign  . Heart murmur   . HSV infection     Allergies  Allergen Reactions  . Aspirin Hives    ROS General: Denies fever, chills, night sweats, changes in weight, changes in appetite HEENT: Denies headaches, ear pain, changes in vision, rhinorrhea, sore throat CV: Denies CP, palpitations, SOB, orthopnea Pulm: Denies SOB, cough, wheezing GI: Denies abdominal pain, nausea, vomiting, diarrhea, constipation GU: Denies dysuria, hematuria, frequency, vaginal discharge  +absent menses Msk: Denies muscle cramps, joint pains Neuro: Denies weakness, numbness, tingling Skin: Denies rashes, bruising Psych: Denies depression, anxiety, hallucinations     Objective:    Blood pressure 110/68, pulse 86, temperature 99 F (37.2 C), temperature source Oral, weight 189 lb (85.7 kg), last menstrual period 06/14/2018, SpO2 98 %.   Gen. Pleasant, well-nourished, in no distress, normal affect   HEENT: [place]/AT, face symmetric, no scleral icterus, PERRLA,  nares patent without drainage Lungs: no accessory muscle use Cardiovascular: RRR Neuro:  A&Ox3, CN II-XII intact, normal gait Skin: warm, dry, intact   Wt Readings from Last 3 Encounters:  07/13/18 189 lb (85.7 kg)  07/04/18 187 lb 13.3 oz (85.2 kg)  05/04/18 190 lb (86.2 kg)    Lab Results  Component Value Date   WBC 8.8 07/05/2018   HGB 8.0 (L) 07/05/2018   HCT 28.1 (L) 07/05/2018   PLT 99 (L) 07/05/2018   GLUCOSE 94 07/04/2018   ALT 12 07/04/2018   AST 16 07/04/2018   NA 137 07/04/2018   K 3.9 07/04/2018   CL 107 07/04/2018   CREATININE 0.68 07/04/2018   BUN 11 07/04/2018   CO2 21 (L) 07/04/2018    Assessment/Plan:  Uterine leiomyoma, unspecified location  - Plan: CANCELED: CBC (no diff)  Other iron deficiency anemia  -per pt had CBC on Monday.  Will hold off on repeat cbc - Plan: CANCELED: CBC (no diff)  Late menses  - Plan: POCT urine pregnancy  Positive urine pregnancy test -Urine hCG positive in clinic -discussed obtaining a Quant hCG, pt wishes to wait at this time -advised to contact OB/GYN, Dr. Garwin Brothers to set up f/u appt.and repeat testing. -continue MVI or PNV  F/u prn  More than 50% of over 25 minutes spent in total in caring for this patient was spent face-to-face with the patient, counseling and/or coordinating care.   Larene [place]  Volanda Napoleon, MD

## 2018-07-13 NOTE — Discharge Instructions (Signed)

## 2018-07-18 DIAGNOSIS — O0901 Supervision of pregnancy with history of infertility, first trimester: Secondary | ICD-10-CM | POA: Diagnosis not present

## 2018-07-18 DIAGNOSIS — Z3A01 Less than 8 weeks gestation of pregnancy: Secondary | ICD-10-CM | POA: Diagnosis not present

## 2018-07-20 DIAGNOSIS — O0901 Supervision of pregnancy with history of infertility, first trimester: Secondary | ICD-10-CM | POA: Diagnosis not present

## 2018-07-20 DIAGNOSIS — Z3A01 Less than 8 weeks gestation of pregnancy: Secondary | ICD-10-CM | POA: Diagnosis not present

## 2018-07-24 DIAGNOSIS — O0901 Supervision of pregnancy with history of infertility, first trimester: Secondary | ICD-10-CM | POA: Diagnosis not present

## 2018-07-27 DIAGNOSIS — O0901 Supervision of pregnancy with history of infertility, first trimester: Secondary | ICD-10-CM | POA: Diagnosis not present

## 2018-07-27 DIAGNOSIS — Z3A01 Less than 8 weeks gestation of pregnancy: Secondary | ICD-10-CM | POA: Diagnosis not present

## 2018-07-31 DIAGNOSIS — Z3A01 Less than 8 weeks gestation of pregnancy: Secondary | ICD-10-CM | POA: Diagnosis not present

## 2018-07-31 DIAGNOSIS — O0901 Supervision of pregnancy with history of infertility, first trimester: Secondary | ICD-10-CM | POA: Diagnosis not present

## 2018-08-11 ENCOUNTER — Other Ambulatory Visit: Payer: Self-pay

## 2018-08-11 ENCOUNTER — Encounter (HOSPITAL_COMMUNITY): Payer: Self-pay | Admitting: *Deleted

## 2018-08-11 ENCOUNTER — Inpatient Hospital Stay (HOSPITAL_COMMUNITY)
Admission: AD | Admit: 2018-08-11 | Discharge: 2018-08-11 | Disposition: A | Discharge status: Home / Self Care | Payer: 59 | Source: Ambulatory Visit | Attending: Obstetrics and Gynecology | Admitting: Obstetrics and Gynecology

## 2018-08-11 ENCOUNTER — Other Ambulatory Visit: Payer: Self-pay | Admitting: Obstetrics and Gynecology

## 2018-08-11 DIAGNOSIS — Z3A01 Less than 8 weeks gestation of pregnancy: Secondary | ICD-10-CM | POA: Diagnosis not present

## 2018-08-11 DIAGNOSIS — O00109 Unspecified tubal pregnancy without intrauterine pregnancy: Secondary | ICD-10-CM | POA: Insufficient documentation

## 2018-08-11 LAB — CREATININE, SERUM
Creatinine, Ser: 0.62 mg/dL (ref 0.44–1.00)
GFR calc Af Amer: >60 mL/min (ref >60)
GFR calc non Af Amer: >60 mL/min (ref >60)

## 2018-08-11 LAB — CBC WITH DIFFERENTIAL/PLATELET
BASOS PCT: 1 %
Basophils Absolute: 0.1 10*3/uL (ref 0.0–0.1)
EOS ABS: 0.5 10*3/uL (ref 0.0–0.7)
Eosinophils Relative: 7 %
HEMATOCRIT: 35.3 % — AB (ref 36.0–46.0)
Hemoglobin: 11.2 g/dL — ABNORMAL LOW (ref 12.0–15.0)
LYMPHS PCT: 23 %
Lymphs Abs: 1.7 10*3/uL (ref 0.7–4.0)
MCH: 24.7 pg — AB (ref 26.0–34.0)
MCHC: 31.7 g/dL (ref 30.0–36.0)
MCV: 77.8 fL — AB (ref 78.0–100.0)
MONOS PCT: 7 %
Monocytes Absolute: 0.5 10*3/uL (ref 0.1–1.0)
NEUTROS ABS: 4.5 10*3/uL (ref 1.7–7.7)
Neutrophils Relative %: 62 %
OTHER: 0 %
PLATELETS: 251 10*3/uL (ref 150–400)
RBC: 4.54 MIL/uL (ref 3.87–5.11)
WBC: 7.3 10*3/uL (ref 4.0–10.5)

## 2018-08-11 LAB — HCG, QUANTITATIVE, PREGNANCY: hCG, Beta Chain, Quant, S: 1668 m[IU]/mL — ABNORMAL HIGH (ref <5)

## 2018-08-11 LAB — BUN: BUN: 10 mg/dL (ref 6–20)

## 2018-08-11 LAB — AST: AST: 14 U/L — ABNORMAL LOW (ref 15–41)

## 2018-08-11 MED ORDER — METHOTREXATE INJECTION FOR WOMEN'S HOSPITAL: 50.0000 mg/m2 | Freq: Once | INTRAMUSCULAR | Status: DC

## 2018-08-11 MED ORDER — METHOTREXATE INJECTION FOR WOMEN'S HOSPITAL
50.0000 mg/m2 | Freq: Once | INTRAMUSCULAR | Status: AC
  Administered 2018-08-11: 100 mg via INTRAMUSCULAR
  Filled 2018-08-11: qty 2

## 2018-08-11 NOTE — MAU Note (Signed)
Pt sent from office for Methotrexate Injection.

## 2018-08-11 NOTE — Progress Notes (Signed)
Pt left ambulatory with spouse prior to RN assessing for any reactions to Methotrexate.

## 2018-08-11 NOTE — Progress Notes (Signed)
Dr. Garwin Brothers notified regarding lab results

## 2018-08-12 LAB — TYPE AND SCREEN
ABO/RH(D): O POS
ANTIBODY SCREEN: POSITIVE

## 2018-08-14 DIAGNOSIS — O00109 Unspecified tubal pregnancy without intrauterine pregnancy: Secondary | ICD-10-CM | POA: Diagnosis not present

## 2018-08-17 DIAGNOSIS — O00109 Unspecified tubal pregnancy without intrauterine pregnancy: Secondary | ICD-10-CM | POA: Diagnosis not present

## 2018-08-25 DIAGNOSIS — O00109 Unspecified tubal pregnancy without intrauterine pregnancy: Secondary | ICD-10-CM | POA: Diagnosis not present

## 2018-08-31 DIAGNOSIS — Z3A01 Less than 8 weeks gestation of pregnancy: Secondary | ICD-10-CM | POA: Diagnosis not present

## 2018-08-31 DIAGNOSIS — O00109 Unspecified tubal pregnancy without intrauterine pregnancy: Secondary | ICD-10-CM | POA: Diagnosis not present

## 2018-09-08 DIAGNOSIS — O00109 Unspecified tubal pregnancy without intrauterine pregnancy: Secondary | ICD-10-CM | POA: Diagnosis not present

## 2018-09-08 DIAGNOSIS — Z3A01 Less than 8 weeks gestation of pregnancy: Secondary | ICD-10-CM | POA: Diagnosis not present

## 2018-09-18 DIAGNOSIS — O00109 Unspecified tubal pregnancy without intrauterine pregnancy: Secondary | ICD-10-CM | POA: Diagnosis not present

## 2018-09-29 DIAGNOSIS — D509 Iron deficiency anemia, unspecified: Secondary | ICD-10-CM | POA: Diagnosis not present

## 2018-10-17 DIAGNOSIS — Z1231 Encounter for screening mammogram for malignant neoplasm of breast: Secondary | ICD-10-CM | POA: Diagnosis not present

## 2018-11-29 ENCOUNTER — Other Ambulatory Visit: Payer: Self-pay | Admitting: Obstetrics and Gynecology

## 2018-11-29 NOTE — Patient Instructions (Addendum)
Your procedure is scheduled on:  Thursday, 12/19  Enter through the Main Entrance of [facility] at: 11:15 am  Pick up the phone at the desk and dial [phone].  Call this number if you have problems the morning of surgery: [phone].  Remember: Do NOT eat food after midnight Wednesday  Do NOT drink clear liquids (including water) after 6:30 am Thursday, day of surgery.  Take these medicines the morning of surgery with a SIP OF WATER:  None  Brush your teeth on the day of surgery.  Stop herbal medications, vitamin supplements, Ibuprofen/NSAIDS at this time.  Do NOT wear jewelry (body piercing), metal hair clips/bobby pins, make-up, or nail polish. Do NOT wear lotions, powders, or perfumes.  You may wear deoderant. Do NOT shave for 48 hours prior to surgery. Do NOT bring valuables to the hospital. Contacts may not be worn into surgery.  Leave suitcase in car.  After surgery it may be brought to your room.  For patients admitted to the hospital, checkout time is 11:00 AM the day of discharge. Have a responsible adult drive you home and stay with you for 24 hours after your procedure.  Home with Husband Merrilee [place] cell [phone].

## 2018-12-07 DIAGNOSIS — N760 Acute vaginitis: Secondary | ICD-10-CM | POA: Diagnosis not present

## 2018-12-08 ENCOUNTER — Encounter (HOSPITAL_COMMUNITY)
Admission: RE | Admit: 2018-12-08 | Discharge: 2018-12-08 | Disposition: A | Discharge status: Home / Self Care | Payer: 59 | Source: Ambulatory Visit | Attending: Obstetrics and Gynecology | Admitting: Obstetrics and Gynecology

## 2018-12-08 ENCOUNTER — Encounter (HOSPITAL_COMMUNITY): Payer: Self-pay

## 2018-12-08 ENCOUNTER — Other Ambulatory Visit: Payer: Self-pay

## 2018-12-08 DIAGNOSIS — Z01818 Encounter for other preprocedural examination: Secondary | ICD-10-CM | POA: Insufficient documentation

## 2018-12-08 DIAGNOSIS — D259 Leiomyoma of uterus, unspecified: Secondary | ICD-10-CM | POA: Diagnosis not present

## 2018-12-08 LAB — CBC
HEMATOCRIT: 38.1 % (ref 36.0–46.0)
Hemoglobin: 12.3 g/dL (ref 12.0–15.0)
MCH: 27.4 pg (ref 26.0–34.0)
MCHC: 32.3 g/dL (ref 30.0–36.0)
MCV: 84.9 fL (ref 80.0–100.0)
NRBC: 0 % (ref 0.0–0.2)
Platelets: 321 10*3/uL (ref 150–400)
RBC: 4.49 MIL/uL (ref 3.87–5.11)
RDW: 14.1 % (ref 11.5–15.5)
WBC: 7.2 10*3/uL (ref 4.0–10.5)

## 2018-12-08 NOTE — Pre-Procedure Instructions (Signed)
SDS BB History Log sent to Lab for patient's previous hx of a blood transfusion in 06/2018 at [facility], 2 units transfused.

## 2018-12-13 DIAGNOSIS — R922 Inconclusive mammogram: Secondary | ICD-10-CM | POA: Diagnosis not present

## 2018-12-13 DIAGNOSIS — R928 Other abnormal and inconclusive findings on diagnostic imaging of breast: Secondary | ICD-10-CM | POA: Diagnosis not present

## 2018-12-13 DIAGNOSIS — N6002 Solitary cyst of left breast: Secondary | ICD-10-CM | POA: Diagnosis not present

## 2018-12-13 DIAGNOSIS — N6320 Unspecified lump in the left breast, unspecified quadrant: Secondary | ICD-10-CM | POA: Diagnosis not present

## 2018-12-14 ENCOUNTER — Other Ambulatory Visit: Payer: Self-pay

## 2018-12-14 ENCOUNTER — Inpatient Hospital Stay (HOSPITAL_COMMUNITY): Payer: 59 | Admitting: Anesthesiology

## 2018-12-14 ENCOUNTER — Inpatient Hospital Stay (HOSPITAL_COMMUNITY)
Admission: RE | Admit: 2018-12-14 | Discharge: 2018-12-17 | DRG: 743 | Disposition: A | Discharge status: Home / Self Care | Payer: 59 | Attending: Obstetrics and Gynecology | Admitting: Obstetrics and Gynecology

## 2018-12-14 ENCOUNTER — Encounter (HOSPITAL_COMMUNITY): Payer: Self-pay | Admitting: Emergency Medicine

## 2018-12-14 ENCOUNTER — Encounter (HOSPITAL_COMMUNITY)
Admission: RE | Disposition: A | Discharge status: Home / Self Care | Payer: Self-pay | Source: Home / Self Care | Attending: Obstetrics and Gynecology

## 2018-12-14 DIAGNOSIS — Z9889 Other specified postprocedural states: Secondary | ICD-10-CM

## 2018-12-14 DIAGNOSIS — D252 Subserosal leiomyoma of uterus: Secondary | ICD-10-CM | POA: Diagnosis not present

## 2018-12-14 DIAGNOSIS — N83202 Unspecified ovarian cyst, left side: Secondary | ICD-10-CM | POA: Diagnosis not present

## 2018-12-14 DIAGNOSIS — D259 Leiomyoma of uterus, unspecified: Secondary | ICD-10-CM | POA: Diagnosis not present

## 2018-12-14 DIAGNOSIS — N8302 Follicular cyst of left ovary: Secondary | ICD-10-CM | POA: Diagnosis not present

## 2018-12-14 DIAGNOSIS — D251 Intramural leiomyoma of uterus: Secondary | ICD-10-CM | POA: Diagnosis not present

## 2018-12-14 HISTORY — PX: MYOMECTOMY: SHX85

## 2018-12-14 LAB — PREGNANCY, URINE: Preg Test, Ur: NEGATIVE

## 2018-12-14 SURGERY — MYOMECTOMY, ABDOMINAL APPROACH
Anesthesia: General

## 2018-12-14 MED ORDER — HYDROMORPHONE HCL 1 MG/ML IJ SOLN
INTRAMUSCULAR | Status: AC
  Filled 2018-12-14: qty 1

## 2018-12-14 MED ORDER — OXYCODONE HCL 5 MG/5ML PO SOLN: 5.0000 mg | Freq: Once | ORAL | Status: DC | PRN

## 2018-12-14 MED ORDER — SODIUM CHLORIDE (PF) 0.9 % IJ SOLN
INTRAMUSCULAR | Status: AC
  Filled 2018-12-14: qty 100

## 2018-12-14 MED ORDER — BUPIVACAINE HCL (PF) 0.25 % IJ SOLN
INTRAMUSCULAR | Status: AC
  Filled 2018-12-14: qty 30

## 2018-12-14 MED ORDER — MIDAZOLAM HCL 5 MG/5ML IJ SOLN
INTRAMUSCULAR | Status: DC | PRN
  Administered 2018-12-14: 2 mg via INTRAVENOUS

## 2018-12-14 MED ORDER — HYDROMORPHONE HCL 1 MG/ML IJ SOLN
INTRAMUSCULAR | Status: AC
  Filled 2018-12-14: qty 0.5

## 2018-12-14 MED ORDER — CEFAZOLIN SODIUM-DEXTROSE 2-4 GM/100ML-% IV SOLN
INTRAVENOUS | Status: AC
  Filled 2018-12-14: qty 100

## 2018-12-14 MED ORDER — ONDANSETRON HCL 4 MG/2ML IJ SOLN: 4.0000 mg | Freq: Four times a day (QID) | INTRAMUSCULAR | Status: DC | PRN

## 2018-12-14 MED ORDER — DIPHENHYDRAMINE HCL 12.5 MG/5ML PO ELIX: 12.5000 mg | ORAL_SOLUTION | Freq: Four times a day (QID) | ORAL | Status: DC | PRN

## 2018-12-14 MED ORDER — SUGAMMADEX SODIUM 200 MG/2ML IV SOLN
INTRAVENOUS | Status: DC | PRN
  Administered 2018-12-14: 190 mg via INTRAVENOUS

## 2018-12-14 MED ORDER — VASOPRESSIN 20 UNIT/ML IV SOLN
INTRAVENOUS | Status: DC | PRN
  Administered 2018-12-14: 62 mL via INTRAMUSCULAR

## 2018-12-14 MED ORDER — HYDROMORPHONE HCL 1 MG/ML IJ SOLN
INTRAMUSCULAR | Status: DC | PRN
  Administered 2018-12-14 (×4): 0.5 mg via INTRAVENOUS
  Administered 2018-12-14: 1 mg via INTRAVENOUS

## 2018-12-14 MED ORDER — BUPIVACAINE HCL (PF) 0.25 % IJ SOLN
INTRAMUSCULAR | Status: DC | PRN
  Administered 2018-12-14: 10 mL

## 2018-12-14 MED ORDER — HYDROMORPHONE 1 MG/ML IV SOLN
INTRAVENOUS | Status: DC
  Administered 2018-12-14: 30 mg via INTRAVENOUS
  Administered 2018-12-14: 0.4 mg via INTRAVENOUS
  Administered 2018-12-15: 0.2 mg via INTRAVENOUS
  Administered 2018-12-15: 1.4 mg via INTRAVENOUS
  Administered 2018-12-15: 0.8 mg via INTRAVENOUS
  Administered 2018-12-15: 1.7 mg via INTRAVENOUS
  Filled 2018-12-14: qty 30

## 2018-12-14 MED ORDER — PANTOPRAZOLE SODIUM 40 MG PO TBEC
40.0000 mg | DELAYED_RELEASE_TABLET | Freq: Every day | ORAL | Status: DC
  Administered 2018-12-15 – 2018-12-17 (×3): 40 mg via ORAL
  Filled 2018-12-14 (×3): qty 1

## 2018-12-14 MED ORDER — DIPHENHYDRAMINE HCL 50 MG/ML IJ SOLN: 12.5000 mg | Freq: Four times a day (QID) | INTRAMUSCULAR | Status: DC | PRN

## 2018-12-14 MED ORDER — FAMOTIDINE 20 MG PO TABS
20.0000 mg | ORAL_TABLET | Freq: Once | ORAL | Status: AC
  Administered 2018-12-14: 20 mg via ORAL

## 2018-12-14 MED ORDER — OXYCODONE HCL 5 MG PO TABS: 5.0000 mg | ORAL_TABLET | Freq: Once | ORAL | Status: DC | PRN

## 2018-12-14 MED ORDER — SIMETHICONE 80 MG PO CHEW
80.0000 mg | CHEWABLE_TABLET | Freq: Four times a day (QID) | ORAL | Status: DC | PRN
  Administered 2018-12-15 – 2018-12-16 (×2): 80 mg via ORAL
  Filled 2018-12-14 (×2): qty 1

## 2018-12-14 MED ORDER — ONDANSETRON HCL 4 MG PO TABS: 4.0000 mg | ORAL_TABLET | Freq: Four times a day (QID) | ORAL | Status: DC | PRN

## 2018-12-14 MED ORDER — DEXTROSE IN LACTATED RINGERS 5 % IV SOLN
INTRAVENOUS | Status: DC
  Administered 2018-12-14 – 2018-12-15 (×2): via INTRAVENOUS

## 2018-12-14 MED ORDER — PROPOFOL 10 MG/ML IV BOLUS
INTRAVENOUS | Status: DC | PRN
  Administered 2018-12-14: 200 mg via INTRAVENOUS

## 2018-12-14 MED ORDER — ACETAMINOPHEN 500 MG PO TABS
1000.0000 mg | ORAL_TABLET | Freq: Once | ORAL | Status: AC
  Administered 2018-12-14: 1000 mg via ORAL

## 2018-12-14 MED ORDER — VASOPRESSIN 20 UNIT/ML IV SOLN
INTRAVENOUS | Status: AC
  Filled 2018-12-14: qty 1

## 2018-12-14 MED ORDER — ACETAMINOPHEN 160 MG/5ML PO SOLN: 960.0000 mg | Freq: Once | ORAL | Status: AC

## 2018-12-14 MED ORDER — FENTANYL CITRATE (PF) 100 MCG/2ML IJ SOLN
INTRAMUSCULAR | Status: AC
  Filled 2018-12-14: qty 2

## 2018-12-14 MED ORDER — METOCLOPRAMIDE HCL 5 MG/ML IJ SOLN
INTRAMUSCULAR | Status: DC | PRN
  Administered 2018-12-14: 10 mg via INTRAVENOUS

## 2018-12-14 MED ORDER — HYDROMORPHONE HCL 1 MG/ML IJ SOLN
0.2500 mg | INTRAMUSCULAR | Status: DC | PRN
  Administered 2018-12-14 (×2): 0.5 mg via INTRAVENOUS

## 2018-12-14 MED ORDER — OXYCODONE HCL 5 MG PO TABS
5.0000 mg | ORAL_TABLET | ORAL | Status: DC | PRN
  Administered 2018-12-15: 10 mg via ORAL
  Administered 2018-12-15: 5 mg via ORAL
  Administered 2018-12-15: 10 mg via ORAL
  Filled 2018-12-14: qty 1
  Filled 2018-12-14 (×2): qty 2
  Filled 2018-12-14: qty 1

## 2018-12-14 MED ORDER — FAMOTIDINE 20 MG PO TABS
ORAL_TABLET | ORAL | Status: AC
  Filled 2018-12-14: qty 1

## 2018-12-14 MED ORDER — METOCLOPRAMIDE HCL 5 MG/ML IJ SOLN
INTRAMUSCULAR | Status: AC
  Filled 2018-12-14: qty 2

## 2018-12-14 MED ORDER — DOCUSATE SODIUM 100 MG PO CAPS
100.0000 mg | ORAL_CAPSULE | Freq: Two times a day (BID) | ORAL | Status: DC
  Administered 2018-12-14 – 2018-12-17 (×6): 100 mg via ORAL
  Filled 2018-12-14 (×6): qty 1

## 2018-12-14 MED ORDER — DEXAMETHASONE SODIUM PHOSPHATE 10 MG/ML IJ SOLN
INTRAMUSCULAR | Status: DC | PRN
  Administered 2018-12-14: 10 mg via INTRAVENOUS

## 2018-12-14 MED ORDER — PROMETHAZINE HCL 25 MG/ML IJ SOLN: 6.2500 mg | INTRAMUSCULAR | Status: DC | PRN

## 2018-12-14 MED ORDER — MIDAZOLAM HCL 2 MG/2ML IJ SOLN
INTRAMUSCULAR | Status: AC
  Filled 2018-12-14: qty 2

## 2018-12-14 MED ORDER — MENTHOL 3 MG MT LOZG: 1.0000 | LOZENGE | OROMUCOSAL | Status: DC | PRN

## 2018-12-14 MED ORDER — ACETAMINOPHEN 500 MG PO TABS
ORAL_TABLET | ORAL | Status: AC
  Filled 2018-12-14: qty 2

## 2018-12-14 MED ORDER — ROCURONIUM BROMIDE 100 MG/10ML IV SOLN
INTRAVENOUS | Status: DC | PRN
  Administered 2018-12-14: 50 mg via INTRAVENOUS
  Administered 2018-12-14: 10 mg via INTRAVENOUS
  Administered 2018-12-14: 5 mg via INTRAVENOUS

## 2018-12-14 MED ORDER — SODIUM CHLORIDE 0.9% FLUSH: 9.0000 mL | INTRAVENOUS | Status: DC | PRN

## 2018-12-14 MED ORDER — LIDOCAINE 2% (20 MG/ML) 5 ML SYRINGE
INTRAMUSCULAR | Status: DC | PRN
  Administered 2018-12-14: 60 mg via INTRAVENOUS

## 2018-12-14 MED ORDER — SCOPOLAMINE 1 MG/3DAYS TD PT72
1.0000 | MEDICATED_PATCH | Freq: Once | TRANSDERMAL | Status: DC
  Administered 2018-12-14: 1.5 mg via TRANSDERMAL

## 2018-12-14 MED ORDER — TRAMADOL HCL 50 MG PO TABS: 50.0000 mg | ORAL_TABLET | Freq: Four times a day (QID) | ORAL | Status: DC | PRN

## 2018-12-14 MED ORDER — ONDANSETRON HCL 4 MG/2ML IJ SOLN
INTRAMUSCULAR | Status: DC | PRN
  Administered 2018-12-14: 4 mg via INTRAVENOUS

## 2018-12-14 MED ORDER — NALOXONE HCL 0.4 MG/ML IJ SOLN: 0.4000 mg | INTRAMUSCULAR | Status: DC | PRN

## 2018-12-14 MED ORDER — CEFAZOLIN SODIUM-DEXTROSE 2-4 GM/100ML-% IV SOLN
2.0000 g | INTRAVENOUS | Status: AC
  Administered 2018-12-14: 2 g via INTRAVENOUS

## 2018-12-14 MED ORDER — LACTATED RINGERS IV SOLN
INTRAVENOUS | Status: DC
  Administered 2018-12-14 (×3): via INTRAVENOUS

## 2018-12-14 MED ORDER — FENTANYL CITRATE (PF) 100 MCG/2ML IJ SOLN
INTRAMUSCULAR | Status: DC | PRN
  Administered 2018-12-14: 50 ug via INTRAVENOUS
  Administered 2018-12-14: 100 ug via INTRAVENOUS
  Administered 2018-12-14 (×3): 50 ug via INTRAVENOUS
  Administered 2018-12-14: 100 ug via INTRAVENOUS

## 2018-12-14 MED ORDER — ROCURONIUM BROMIDE 100 MG/10ML IV SOLN
INTRAVENOUS | Status: AC
  Filled 2018-12-14: qty 1

## 2018-12-14 MED ORDER — SCOPOLAMINE 1 MG/3DAYS TD PT72
MEDICATED_PATCH | TRANSDERMAL | Status: AC
  Administered 2018-12-14: 1.5 mg via TRANSDERMAL
  Filled 2018-12-14: qty 1

## 2018-12-14 SURGICAL SUPPLY — 49 items
BARRIER ADHS 3X4 INTERCEED (GAUZE/BANDAGES/DRESSINGS) ×6 IMPLANT
CANISTER SUCT 3000ML PPV (MISCELLANEOUS) ×2 IMPLANT
CONT PATH 16OZ SNAP LID 3702 (MISCELLANEOUS) ×2 IMPLANT
CONT SPEC PATH 64OZ SNAP LID (MISCELLANEOUS) ×2 IMPLANT
DECANTER SPIKE VIAL GLASS SM (MISCELLANEOUS) ×2 IMPLANT
DRAPE CESAREAN BIRTH W POUCH (DRAPES) ×2 IMPLANT
DRSG OPSITE POSTOP 4X10 (GAUZE/BANDAGES/DRESSINGS) ×2 IMPLANT
DURAPREP 26ML APPLICATOR (WOUND CARE) ×2 IMPLANT
ELECT CAUTERY BLADE 6.4 (BLADE) ×2 IMPLANT
ELECT NEEDLE TIP 2.8 STRL (NEEDLE) ×2 IMPLANT
GAUZE 4X4 16PLY RFD (DISPOSABLE) IMPLANT
GAUZE SPONGE 4X4 12PLY STRL LF (GAUZE/BANDAGES/DRESSINGS) ×2 IMPLANT
GLOVE BIOGEL PI IND STRL 7.0 (GLOVE) ×3 IMPLANT
GLOVE BIOGEL PI INDICATOR 7.0 (GLOVE) ×3
GLOVE ECLIPSE 6.5 STRL STRAW (GLOVE) ×2 IMPLANT
GOWN STRL REUS W/TWL LRG LVL3 (GOWN DISPOSABLE) ×6 IMPLANT
HEMOSTAT SURGICEL 4X8 (HEMOSTASIS) ×2 IMPLANT
HIBICLENS CHG 4% 4OZ BTL (MISCELLANEOUS) ×2 IMPLANT
NEEDLE HYPO 22GX1.5 SAFETY (NEEDLE) ×2 IMPLANT
NEEDLE SPNL 22GX3.5 QUINCKE BK (NEEDLE) ×2 IMPLANT
NS IRRIG 1000ML POUR BTL (IV SOLUTION) ×2 IMPLANT
PACK ABDOMINAL GYN (CUSTOM PROCEDURE TRAY) ×2 IMPLANT
PAD OB MATERNITY 4.3X12.25 (PERSONAL CARE ITEMS) ×2 IMPLANT
PROTECTOR NERVE ULNAR (MISCELLANEOUS) ×2 IMPLANT
RETRACTOR WND ALEXIS 25 LRG (MISCELLANEOUS) ×1 IMPLANT
RTRCTR WOUND ALEXIS 25CM LRG (MISCELLANEOUS) ×2
SPONGE LAP 18X18 X RAY DECT (DISPOSABLE) ×20 IMPLANT
STAPLER VISISTAT 35W (STAPLE) IMPLANT
SUT MON AB 2-0 CT1 36 (SUTURE) ×2 IMPLANT
SUT MON AB 2-0 SH 27 (SUTURE) ×1
SUT MON AB 2-0 SH27 (SUTURE) ×1 IMPLANT
SUT MON AB 3-0 SH 27 (SUTURE) ×7
SUT MON AB 3-0 SH27 (SUTURE) ×7 IMPLANT
SUT MON AB 4-0 PS1 27 (SUTURE) ×2 IMPLANT
SUT PLAIN 2 0 XLH (SUTURE) ×2 IMPLANT
SUT VIC AB 0 CT1 18XCR BRD8 (SUTURE) ×2 IMPLANT
SUT VIC AB 0 CT1 27 (SUTURE) ×4
SUT VIC AB 0 CT1 27XBRD ANBCTR (SUTURE) ×4 IMPLANT
SUT VIC AB 0 CT1 36 (SUTURE) ×4 IMPLANT
SUT VIC AB 0 CT1 8-18 (SUTURE) ×2
SUT VIC AB 2-0 CT1 27 (SUTURE) ×1
SUT VIC AB 2-0 CT1 TAPERPNT 27 (SUTURE) ×1 IMPLANT
SUT VIC AB 2-0 SH 27 (SUTURE)
SUT VIC AB 2-0 SH 27XBRD (SUTURE) IMPLANT
SUT VIC AB 4-0 KS 27 (SUTURE) ×2 IMPLANT
SUT VICRYL 4-0 PS2 18IN ABS (SUTURE) IMPLANT
SYR CONTROL 10ML LL (SYRINGE) ×4 IMPLANT
TOWEL OR 17X24 6PK STRL BLUE (TOWEL DISPOSABLE) ×4 IMPLANT
TRAY FOLEY W/BAG SLVR 14FR (SET/KITS/TRAYS/PACK) ×2 IMPLANT

## 2018-12-14 NOTE — Anesthesia Preprocedure Evaluation (Signed)
Anesthesia Evaluation  Patient identified by MRN, date of birth, ID band Patient awake    Reviewed: Allergy & Precautions, NPO status , Patient's Chart, lab work & pertinent test results  Airway Mallampati: II  TM Distance: >3 FB Neck ROM: Full    Dental no notable dental hx.    Pulmonary neg pulmonary ROS,    Pulmonary exam normal breath sounds clear to auscultation       Cardiovascular negative cardio ROS Normal cardiovascular exam Rhythm:Regular Rate:Normal + Diastolic murmurs ECG: SB, rate 57  ECHO: LV EF: 55% -   60%   Neuro/Psych negative neurological ROS  negative psych ROS   GI/Hepatic negative GI ROS, Neg liver ROS,   Endo/Other  negative endocrine ROS  Renal/GU negative Renal ROS     Musculoskeletal negative musculoskeletal ROS (+)   Abdominal (+) + obese,   Peds  Hematology negative hematology ROS (+)   Anesthesia Other Findings Symptomatic Uterine Fibroids  Reproductive/Obstetrics hcg negative                             Anesthesia Physical Anesthesia Plan  ASA: II  Anesthesia Plan: General   Post-op Pain Management:    Induction: Intravenous  PONV Risk Score and Plan: 4 or greater and Ondansetron, Dexamethasone, Midazolam, Scopolamine patch - Pre-op and Treatment may vary due to age or medical condition  Airway Management Planned: Oral ETT  Additional Equipment:   Intra-op Plan:   Post-operative Plan: Extubation in OR  Informed Consent: I have reviewed the patients History and Physical, chart, labs and discussed the procedure including the risks, benefits and alternatives for the proposed anesthesia with the patient or authorized representative who has indicated his/her understanding and acceptance.   Dental advisory given  Plan Discussed with: CRNA  Anesthesia Plan Comments:         Anesthesia Quick Evaluation

## 2018-12-14 NOTE — Brief Op Note (Signed)
12/14/2018  5:21 PM  PATIENT:  Emily Myers  42 y.o. female  PRE-OPERATIVE DIAGNOSIS:  Symptomatic Uterine Fibroids  POST-OPERATIVE DIAGNOSIS:  Symptomatic Uterine Fibroids  PROCEDURE:  Exploratory laparotomy, myomectomy  SURGEON:  Surgeon(s) and Role:    * Servando Salina, MD - Primary    * Almquist, Earlyne Iba, MD - Assisting  PHYSICIAN ASSISTANT:   ASSISTANTS: Tiana Loft, M.D.   ANESTHESIA:   general FINDINGS; nl right ov, nl tubes, left ovarian cyst, fibroid uterus with multiple IM/SS fibroids. Fibrotic uterus EBL:  550 mL   BLOOD ADMINISTERED:none  DRAINS: none   LOCAL MEDICATIONS USED:  MARCAINE     SPECIMEN:  Source of Specimen:  myomas  DISPOSITION OF SPECIMEN:  PATHOLOGY  COUNTS:  YES  TOURNIQUET:  * No tourniquets in log *  DICTATION: .Other Dictation: Dictation Number [id number]  PLAN OF CARE: Admit to inpatient   PATIENT DISPOSITION:  PACU - hemodynamically stable.   Delay start of Pharmacological VTE agent (>24hrs) due to surgical blood loss or risk of bleeding: no

## 2018-12-14 NOTE — Anesthesia Procedure Notes (Signed)
Procedure Name: Intubation Date/Time: 12/14/2018 1:10 PM Performed by: [place] Demarko Zeimet C, CRNA Pre-anesthesia Checklist: Patient identified, Emergency Drugs available, Suction available and Patient being monitored Patient Re-evaluated:Patient Re-evaluated prior to induction Oxygen Delivery Method: Circle system utilized Preoxygenation: Pre-oxygenation with 100% oxygen Induction Type: IV induction Ventilation: Mask ventilation without difficulty Laryngoscope Size: Mac and 3 Grade View: Grade I Tube type: Oral Tube size: 7.0 mm Number of attempts: 1 Airway Equipment and Method: Stylet and Oral airway Placement Confirmation: ETT inserted through vocal cords under direct vision,  positive ETCO2 and breath sounds checked- equal and bilateral Secured at: 22 cm Tube secured with: Tape Dental Injury: Teeth and Oropharynx as per pre-operative assessment

## 2018-12-14 NOTE — Anesthesia Postprocedure Evaluation (Signed)
Anesthesia Post Note  Patient: [name]  Procedure(s) Performed: Exploratory Laparotomy MYOMECTOMY (N/A )     Patient location during evaluation: PACU Anesthesia Type: General Level of consciousness: awake and alert Pain management: pain level controlled Vital Signs Assessment: post-procedure vital signs reviewed and stable Respiratory status: spontaneous breathing, nonlabored ventilation, respiratory function stable and patient connected to nasal cannula oxygen Cardiovascular status: blood pressure returned to baseline and stable Postop Assessment: no apparent nausea or vomiting Anesthetic complications: no    Last Vitals:  Vitals:   12/14/18 1619 12/14/18 1630  BP: (!) 184/83 (!) 182/87  Pulse: 95 (!) 104  Resp: 15 16  Temp: 36.7 C   SpO2: 99% 98%    Last Pain:  Vitals:   12/14/18 1630  TempSrc:   PainSc: 7    Pain Goal: Patients Stated Pain Goal: 3 (12/14/18 1630)               Emily Myers P Emily Myers

## 2018-12-14 NOTE — Anesthesia Postprocedure Evaluation (Signed)
Anesthesia Post Note  Patient: [name]  Procedure(s) Performed: Exploratory Laparotomy MYOMECTOMY (N/A )     Patient location during evaluation: [facility] Anesthesia Type: General Level of consciousness: awake and alert Pain management: pain level controlled Vital Signs Assessment: post-procedure vital signs reviewed and stable Respiratory status: spontaneous breathing, nonlabored ventilation and respiratory function stable Cardiovascular status: blood pressure returned to baseline and stable Postop Assessment: no apparent nausea or vomiting Anesthetic complications: no    Last Vitals:  Vitals:   12/14/18 1824 12/14/18 1835  BP: (!) 148/77   Pulse: 69   Resp: 12 12  Temp: 36.9 C   SpO2: 100% 100%    Last Pain:  Vitals:   12/14/18 1837  TempSrc:   PainSc: 5    Pain Goal: Patients Stated Pain Goal: 3 (12/14/18 1724)               Riki Sheer

## 2018-12-14 NOTE — Addendum Note (Signed)
Addendum  created 12/14/18 1903 by Riki Sheer, CRNA   Clinical Note Signed

## 2018-12-14 NOTE — Transfer of Care (Signed)
Immediate Anesthesia Transfer of Care Note  Patient: Emily Myers  Procedure(s) Performed: Exploratory Laparotomy MYOMECTOMY (N/A )  Patient Location: PACU  Anesthesia Type:General  Level of Consciousness: awake, alert  and oriented  Airway & Oxygen Therapy: Patient Spontanous Breathing and Patient connected to nasal cannula oxygen  Post-op Assessment: Report given to RN and Post -op Vital signs reviewed and stable  Post vital signs: Reviewed and stable  Last Vitals:  Vitals Value Taken Time  BP 184/83 12/14/2018  4:19 PM  Temp    Pulse 96 12/14/2018  4:21 PM  Resp 10 12/14/2018  4:21 PM  SpO2 99 % 12/14/2018  4:21 PM  Vitals shown include unvalidated device data.  Last Pain:  Vitals:   12/14/18 1214  TempSrc: Oral  PainSc: 1       Patients Stated Pain Goal: 3 (11/73/56 7014)  Complications: No apparent anesthesia complications

## 2018-12-15 ENCOUNTER — Encounter (HOSPITAL_COMMUNITY): Payer: Self-pay | Admitting: Obstetrics and Gynecology

## 2018-12-15 LAB — CBC
HCT: 30.1 % — ABNORMAL LOW (ref 36.0–46.0)
Hemoglobin: 9.8 g/dL — ABNORMAL LOW (ref 12.0–15.0)
MCH: 27 pg (ref 26.0–34.0)
MCHC: 32.6 g/dL (ref 30.0–36.0)
MCV: 82.9 fL (ref 80.0–100.0)
Platelets: 262 10*3/uL (ref 150–400)
RBC: 3.63 MIL/uL — ABNORMAL LOW (ref 3.87–5.11)
RDW: 14.3 % (ref 11.5–15.5)
WBC: 8.8 10*3/uL (ref 4.0–10.5)
nRBC: 0 % (ref 0.0–0.2)

## 2018-12-15 NOTE — Progress Notes (Signed)
Subjective: Patient reports incisional pain and tolerating PO.  Foley out at noon  Objective: I have reviewed patient's vital signs.  vital signs, intake and output and labs. Vitals:   12/15/18 0801 12/15/18 1244  BP: 123/62 113/69  Pulse: 68 80  Resp: 13 16  Temp: 98.7 F (37.1 C) 98.6 F (37 C)  SpO2: 99% 100%   I/O last 3 completed shifts: In: 3200 [P.O.:300; I.V.:2900] Out: 2925 [Urine:2375; Blood:550] Total I/O In: -  Out: 400 [Urine:400]  Lab Results  Component Value Date   WBC 8.8 12/15/2018   HGB 9.8 (L) 12/15/2018   HCT 30.1 (L) 12/15/2018   MCV 82.9 12/15/2018   PLT 262 12/15/2018   Lab Results  Component Value Date   CREATININE 0.62 08/11/2018    EXAM General: alert, cooperative and no distress Resp: clear to auscultation bilaterally Cardio: regular rate and rhythm, S1, S2 normal, no murmur, click, rub or gallop GI: soft, non-tender; bowel sounds normal; no masses,  no organomegaly and incision: clean, dry and intact Extremities: no edema, redness or tenderness in the calves or thighs Vaginal Bleeding: on cycle  Assessment: s/p Procedure(s): Exploratory Laparotomy MYOMECTOMY: stable and tolerating diet Anemia Plan: Advance diet Encourage ambulation Discontinue IV fluids await void  LOS: 1 day    Marvene Staff, MD 12/15/2018 2:09 PM    12/15/2018, 2:09 PM

## 2018-12-15 NOTE — Op Note (Signed)
NAME: Emily Myers, Emily Myers [facility] MEDICAL RECORD [id number] ACCOUNT [id number] DATE OF BIRTH:24-Dec-1976 FACILITY: [facility] LOCATION: [id number] PHYSICIAN:Vishaal Strollo A. Clairessa Boulet, MD  OPERATIVE REPORT  DATE OF PROCEDURE:  12/14/2018  PREOPERATIVE DIAGNOSIS:  Symptomatic uterine fibroids.  PROCEDURE:  Exploratory laparotomy, multiple myomectomy.  POSTOPERATIVE DIAGNOSIS:  Symptomatic uterine fibroids.  ANESTHESIA:  General.  SURGEON:  Servando Salina, MD  ASSISTANT:  Tiana Loft MD.  DESCRIPTION OF PROCEDURE:  Under adequate general anesthesia, the patient was placed in a supine position.  She was sterilely prepped and draped in the usual fashion.  An indwelling Foley catheter was sterilely placed.  Marcaine 0.25% was injected along the planned Pfannenstiel skin incision site.  Pfannenstiel skin incision was then made, carried down to the rectus fascia.  Rectus fascia opened transversely.  The rectus fascia was then bluntly and sharply dissected off the rectus muscle in a superior and inferior fashion.  The rectus muscle split in the midline.  The parietal peritoneum was entered bluntly and extended.  Pelvic examination revealed a uterus with multiple fibroids, subserosal, intramural and pedunculated.  The left ovary had an ovarian cyst.  The right ovary was normal.  Both tubes were normal.  The self-retaining Alexis retractor was then placed.  Attempt at exteriorizing the uterus was unsuccessful.  Using a dilute solution of Pitressin, Pitressin was injected overlying posterior subserosal fibroid and using a single tooth tenaculum attempt at bringing up the uterus into the field was again attempted.  Partial exteriorizing of the uterus was done allowing for a vertical posterior incision to be made and through that  incision multiple fibroids were removed.  There was a left subserosal lateral fibroid for which a separate incision had to be made in order to facilitate removal and that was done with  removal of that fibroid.  The majority of the fibroids were of degenerating, particular that the larger fibroid on the left resulting in it being removed in pieces.  Once both defects had maximally removed as much fibroid that can be done from their site, the deep layers was closed with 0 Vicryl  figure-of-eight sutures until the serosal surface was reached at which time either a 2-0 Monocryl or a 3-0 Monocryl was then used in a baseball fashion to close that incision.  Attention was then turned anteriorly and 2 subserosal fibroids proximally and a larger fibroid below the bladder peritoneum was noted and that resulted in dilute solution of Pitressin also being injected along those 3 fibroids.  The vesicoperitoneum was opened transversely superiorly and displaced inferiorly with blunt dissection  exposing that fibroid.  A vertical incision was then made and the fibroid was then removed.  A lower segment fibroid subsequently was then noted that was also removed.  Once that was done, again those defects was then closed with 0 Vicryl figure-of-eight sutures.  Bleeding on the left from some vessels resulted in 3-0 Monocryl sutures being placed for hemostasis.  Once that area and fibroid sites were removed, another fibroid on the right in that lower segment was identified and also  removed.  Its base was also closed with 0 Vicryl figure-of-eight sutures and 3-0 Monocryl sutures.  The additional 2 superior fibroids were removed as was a small pedunculated fibroid and closed in a similar fashion as described with the other fibroids.   Additional figure-of-eight sutures were placed for hemostasis.  The bladder peritoneum was tacked back in a separate area.  Bleeding was subsequently noted and the bladder peritoneum was again pulled down inferiorly and additional  3-0 Monocryl suturing was placed with hemostasis then noted.  The abdomen was irrigated and suctioned of debris.  Surgicel was placed in the left anterior  space. Posteriorly, Interceed was placed overlying the incisions in the back.  Attention was then turned to the left  ovary where that ovarian cyst was noted.  It was opened.  The cyst wall was removed and the cyst area was then closed with 3-0 Monocryl suture.  That ovary was wrapped with Interceed.  Anteriorly Interceed was then placed.  The Alexis retractor was removed.  The parietal peritoneum was then closed with 2-0 Vicryl.  The rectus fascia was closed with 0 Vicryl x2.  The subcutaneous area was irrigated, small bleeders cauterized.  Interrupted 2-0 plain sutures placed and the skin approximated using 4-0  Vicryl subcuticular closure.  Steri-Strips and benzoin was placed.  SPECIMEN:  Multiple myomas and the left ovarian cyst wall sent to pathology.  ESTIMATED BLOOD LOSS:  550 mL  INTRAOPERATIVE FLUIDS:  2400 mL  URINE OUTPUT:  275 mL  COUNTS:  Sponge and instrument counts x2 was correct.  COMPLICATIONS:  None.  The patient tolerated the procedure well and was transferred to recovery in stable condition.  TN/NUANCE  D:12/14/2018 T:12/15/2018 JOB:[id number]

## 2018-12-16 MED ORDER — SENNA 8.6 MG PO TABS: 2.0000 | ORAL_TABLET | Freq: Once | ORAL | Status: DC

## 2018-12-16 MED ORDER — BISACODYL 10 MG RE SUPP
10.0000 mg | Freq: Once | RECTAL | Status: AC
  Administered 2018-12-16: 10 mg via RECTAL
  Filled 2018-12-16: qty 1

## 2018-12-16 MED ORDER — BISACODYL 10 MG RE SUPP
10.0000 mg | Freq: Once | RECTAL | Status: DC
  Filled 2018-12-16: qty 1

## 2018-12-16 MED ORDER — SENNOSIDES-DOCUSATE SODIUM 8.6-50 MG PO TABS
2.0000 | ORAL_TABLET | Freq: Once | ORAL | Status: AC
  Administered 2018-12-16: 2 via ORAL
  Filled 2018-12-16: qty 2

## 2018-12-16 NOTE — Progress Notes (Signed)
Subjective: Patient reports tolerating PO and no problems voiding.    Objective: I have reviewed patient's vital signs.  vital signs. Vitals:   12/16/18 0850 12/16/18 1257  BP: 125/73 119/74  Pulse: 84 80  Resp: 18 16  Temp: 99.9 F (37.7 C) 98.9 F (37.2 C)  SpO2: 100% 100%   I/O last 3 completed shifts: In: 300 [P.O.:300] Out: 3050 [Urine:3050] Total I/O In: -  Out: 600 [Urine:600]  Lab Results  Component Value Date   WBC 8.8 12/15/2018   HGB 9.8 (L) 12/15/2018   HCT 30.1 (L) 12/15/2018   MCV 82.9 12/15/2018   PLT 262 12/15/2018   Lab Results  Component Value Date   CREATININE 0.62 08/11/2018    EXAM General: alert, cooperative and no distress Resp: clear to auscultation bilaterally Cardio: RRR. grade 3/6 SEM GI: incision: clean, dry and intact and soft protuberant hypoactive BS Extremities: no edema, redness or tenderness in the calves or thighs Vaginal Bleeding: minimal  Assessment: s/p Procedure(s): Exploratory Laparotomy MYOMECTOMY: stable and with slow bowel function  Plan: Encourage ambulation bowel regimen  LOS: 2 days    Marvene Staff, MD 12/16/2018 4:24 PM    12/16/2018, 4:24 PM

## 2018-12-17 MED ORDER — TRAMADOL HCL 50 MG PO TABS: 50.0000 mg | ORAL_TABLET | Freq: Four times a day (QID) | ORAL | 1 refills | Status: DC | PRN

## 2018-12-17 MED ORDER — OXYCODONE HCL 5 MG PO TABS: 5.0000 mg | ORAL_TABLET | ORAL | 0 refills | Status: AC | PRN

## 2018-12-17 NOTE — Discharge Summary (Signed)
Physician Discharge Summary  Patient ID: Emily Myers MRN: [id number] DOB/AGE: 1976-02-01 42 y.o.  Admit date: 12/14/2018 Discharge date: 12/17/2018  Admission Diagnoses: symptomatic uterine fibroids  Discharge Diagnoses:  same Active Problems:   Uterine fibroid   S/P myomectomy   Discharged Condition: stable  Hospital Course: pt was admitted to [facility] where she underwent exp lap, myomectomy. Postop course notable for slow bowel function that resolved on day 3.  Pt was tolerating regular diet, had BM and pain controlled prior to leaving  Consults: None  Significant Diagnostic Studies: labs:  CBC    Component Value Date/Time   WBC 8.8 12/15/2018 0722   RBC 3.63 (L) 12/15/2018 0722   HGB 9.8 (L) 12/15/2018 0722   HCT 30.1 (L) 12/15/2018 0722   PLT 262 12/15/2018 0722   MCV 82.9 12/15/2018 0722   MCH 27.0 12/15/2018 0722   MCHC 32.6 12/15/2018 0722   RDW 14.3 12/15/2018 0722   LYMPHSABS 1.7 08/11/2018 1424   MONOABS 0.5 08/11/2018 1424   EOSABS 0.5 08/11/2018 1424   BASOSABS 0.1 08/11/2018 1424     Treatments: surgery: exp lap, myomectomy  Discharge Exam: Blood pressure 118/73, pulse 74, temperature 98.7 F (37.1 C), temperature source Oral, resp. rate 19, height 5\' 2"  (1.575 m), weight 93 kg, last menstrual period 12/14/2018, SpO2 100 %. General appearance: alert, cooperative and no distress Resp: clear to auscultation bilaterally Cardio: regular rate and rhythm GI: soft (+) BS non distended Pelvic: deferred Extremities: no edema, redness or tenderness in the calves or thighs Incision/Wound: d/c/i   Disposition: Discharge disposition: 01-Home or Self Care       Discharge Instructions    Call MD for:  persistant nausea and vomiting   Complete by:  As directed    Call MD for:  redness, tenderness, or signs of infection (pain, swelling, redness, odor or green/yellow discharge around incision site)   Complete by:  As directed    Call MD for:   temperature >100.4   Complete by:  As directed    Diet - low sodium heart healthy   Complete by:  As directed    Increase activity slowly   Complete by:  As directed      Allergies as of 12/17/2018      Reactions   Aspirin Hives      Medication List    TAKE these medications   ferrous sulfate 325 (65 FE) MG tablet Take 1 tablet (325 mg total) by mouth daily with breakfast. Increase to twice daily as tolerated What changed:    how much to take  additional instructions   JUNEL FE 1/20 1-20 MG-MCG tablet Generic drug:  norethindrone-ethinyl estradiol Take 1 tablet by mouth daily.   oxyCODONE 5 MG immediate release tablet Commonly known as:  Oxy IR/ROXICODONE Take 1-2 tablets (5-10 mg total) by mouth every 4 (four) hours as needed for up to 7 days for moderate pain.   traMADol 50 MG tablet Commonly known as:  ULTRAM Take 1 tablet (50 mg total) by mouth every 6 (six) hours as needed (mild pain).      Follow-up Information    Servando Salina, MD Follow up in 4 week(s).   Specialty:  Obstetrics and Gynecology Contact information: [place] [address] [phone]           Signed: Marvene Staff 12/17/2018, 7:52 AM

## 2018-12-17 NOTE — Discharge Instructions (Signed)
Call if temperature greater than equal to 100.4, nothing per vagina for 4-6 weeks or severe nausea vomiting, increased incisional pain , drainage or redness in the incision site, no straining with bowel movements, showers no bath °

## 2018-12-17 NOTE — Progress Notes (Signed)
Subjective: Patient reports tolerating PO, + flatus, + BM and no problems voiding.    Objective: I have reviewed patient's vital signs.  vital signs. Vitals:   12/17/18 0001 12/17/18 0612  BP: 132/63 118/73  Pulse: 79 74  Resp: 18 19  Temp: 98.7 F (37.1 C) 98.7 F (37.1 C)  SpO2: 100% 100%   I/O last 3 completed shifts: In: 180 [P.O.:180] Out: 1850 [Urine:1850] No intake/output data recorded.  Lab Results  Component Value Date   WBC 8.8 12/15/2018   HGB 9.8 (L) 12/15/2018   HCT 30.1 (L) 12/15/2018   MCV 82.9 12/15/2018   PLT 262 12/15/2018   Lab Results  Component Value Date   CREATININE 0.62 08/11/2018    EXAM General: alert, cooperative and no distress Resp: clear to auscultation bilaterally Cardio: regular rate and rhythm GI: soft, non-tender; bowel sounds normal; no masses,  no organomegaly and incision: clean, dry and intact Extremities: no edema, redness or tenderness in the calves or thighs Vaginal Bleeding: minimal  Assessment: s/p Procedure(s): Exploratory Laparotomy MYOMECTOMY: stable, progressing well, tolerating diet and anemia  Plan: Encourage ambulation Discharge home  D/c instructions reviewed. F/u 4 weeks  LOS: 3 days    Marvene Staff, MD 12/17/2018 7:43 AM    12/17/2018, 7:43 AM

## 2018-12-17 NOTE — Progress Notes (Signed)
Discharge instructions and prescriptions given to pt. Discussed post-op myomectomy care, signs and symptoms to report to the MD, upcoming appointments, and medication regimen. Pt verbalizes understanding and has no questions or concerns at this time. Pt discharged from hospital in stable condition.

## 2018-12-18 LAB — TYPE AND SCREEN
ABO/RH(D): O POS
Antibody Screen: NEGATIVE
UNIT DIVISION: 0
Unit division: 0

## 2018-12-18 LAB — BPAM RBC
Blood Product Expiration Date: 201912282359
Blood Product Expiration Date: 202001162359
ISSUE DATE / TIME: 201912201756
Unit Type and Rh: 5100
Unit Type and Rh: 5100

## 2019-01-16 DIAGNOSIS — Z09 Encounter for follow-up examination after completed treatment for conditions other than malignant neoplasm: Secondary | ICD-10-CM | POA: Diagnosis not present

## 2019-02-21 DIAGNOSIS — R350 Frequency of micturition: Secondary | ICD-10-CM | POA: Diagnosis not present

## 2019-04-16 ENCOUNTER — Encounter: Payer: Self-pay | Admitting: Family Medicine

## 2019-04-16 ENCOUNTER — Ambulatory Visit (INDEPENDENT_AMBULATORY_CARE_PROVIDER_SITE_OTHER): Payer: 59 | Admitting: Family Medicine

## 2019-04-16 ENCOUNTER — Other Ambulatory Visit: Payer: Self-pay

## 2019-04-16 ENCOUNTER — Ambulatory Visit: Payer: Self-pay | Admitting: Family Medicine

## 2019-04-16 DIAGNOSIS — M545 Low back pain, unspecified: Secondary | ICD-10-CM

## 2019-04-16 LAB — POCT URINALYSIS DIPSTICK
Bilirubin, UA: NEGATIVE
Blood, UA: NEGATIVE
Glucose, UA: NEGATIVE
Ketones, UA: NEGATIVE
Leukocytes, UA: NEGATIVE
Odor: NEGATIVE
Protein, UA: POSITIVE — AB
Spec Grav, UA: >=1.03 — AB (ref 1.010–1.025)
Urobilinogen, UA: 0.2 E.U./dL
pH, UA: 6 (ref 5.0–8.0)

## 2019-04-16 MED ORDER — CYCLOBENZAPRINE HCL 5 MG PO TABS: 5.0000 mg | ORAL_TABLET | Freq: Three times a day (TID) | ORAL | 0 refills | Status: DC | PRN

## 2019-04-16 NOTE — Progress Notes (Signed)
Virtual Visit via Video Note  I connected with Emily Myers on 04/16/19 at  1:30 PM EDT by a video enabled telemedicine application and verified that I am speaking with the correct person using two identifiers.  Location patient: home Location provider: home office Persons participating in the virtual visit: patient, provider  I discussed the limitations of evaluation and management by telemedicine and the availability of in person appointments. The patient expressed understanding and agreed to proceed.   HPI: Pt states she has been going though a lot since her last OFV in July 2019.  After positive pregnancy test in office, pt notes quant levels were not rising appropriately, was given Methotrexate injection.  Pt had "23 fibroids" removed in Dec.   Pt had her first UTI in march, received abx.  Had symptoms a few days ago that went away, then came back.  Pt endorses pain in the middle of her back.  Does not recall heavy lifting.  Had more of a tingling sensation-now resolved.   Has increased po intake of water, so isn't sure if really having frequency.  Denies odor.  Aleeve and tramadol have not helped the back pain.  ROS: See pertinent positives and negatives per HPI.  Past Medical History:  Diagnosis Date  . Anemia   . Blood transfusion without reported diagnosis 06/2018   [facility] - 2 units transfused  . Endometrial polyp    Benign  . Heart murmur    as a child, never has caused any problems  . HSV infection   . SVD (spontaneous vaginal delivery)    x 2    Past Surgical History:  Procedure Laterality Date  . D&C x 2     Polyp removal 2011 by Dr. Cheri Rous in [place]  . DILATION AND CURETTAGE OF UTERUS    . Hysterosalpingography    . iron infusions    . MYOMECTOMY N/A 12/14/2018   Procedure: Exploratory Laparotomy MYOMECTOMY;  Surgeon: Servando Salina, MD;  Location: [facility];  Service: Gynecology;  Laterality: N/A;  2hrs.  . WISDOM TOOTH EXTRACTION      Family History   Problem Relation Age of Onset  . Eczema Mother     SOCIAL HX: Pt states her family is doing well.  States her daughter and the rest of her family in [place] are ok.  Pt notes her husband typically travels for work, but has been working from home since mid March.  Pt's husband was sick with pneumonia last month, but tested negative for COVID-19.  He is doing better.   Current Outpatient Medications:  .  ferrous sulfate 325 (65 FE) MG tablet, Take 1 tablet (325 mg total) by mouth daily with breakfast. Increase to twice daily as tolerated (Patient taking differently: Take 650 mg by mouth daily with breakfast. ), Disp: 60 tablet, Rfl: 0 .  JUNEL FE 1/20 1-20 MG-MCG tablet, Take 1 tablet by mouth daily., Disp: , Rfl: 2 .  traMADol (ULTRAM) 50 MG tablet, Take 1 tablet (50 mg total) by mouth every 6 (six) hours as needed (mild pain)., Disp: 30 tablet, Rfl: 1  EXAM:  VITALS per patient if applicable:  RR between 12-20 bpm  GENERAL: alert, oriented, appears well and in no acute distress  HEENT: atraumatic, conjunctiva clear, no obvious abnormalities on inspection of external nose and ears  NECK: normal movements of the head and neck  LUNGS: on inspection no signs of respiratory distress, breathing rate appears normal, no obvious gross SOB, gasping or  wheezing  CV: no obvious cyanosis  MS: moves all visible extremities without noticeable abnormality  PSYCH/NEURO: pleasant and cooperative, no obvious depression or anxiety, speech and thought processing grossly intact  ASSESSMENT AND PLAN:  Discussed the following assessment and plan:  Acute midline low back pain without sciatica  -discussed possible causes including MSK strain, UTI, renal calculi (though no movement of pain) -will obtain UA if needed will also obtain UCx and rx abx.    -if UA negative will send in rx for muscle relaxer. - Plan: POCT urinalysis dipstick -given precautions ----Update:  Pt called with UA results.  UA  with 1+nitrite, protein, and SG >1.030, negative leuks.  Will proceed with UCx despite negative leuks.  Pt to increase po intake of fluids, will send in rx for muscle relaxer.    F/u prn   I discussed the assessment and treatment plan with the patient. The patient was provided an opportunity to ask questions and all were answered. The patient agreed with the plan and demonstrated an understanding of the instructions.   The patient was advised to call back or seek an in-person evaluation if the symptoms worsen or if the condition fails to improve as anticipated.  Billie Ruddy, MD

## 2019-04-16 NOTE — Telephone Encounter (Signed)
Pt scheduled for virtual visit today.  

## 2019-04-16 NOTE — Telephone Encounter (Signed)
Pt. Reports she had a UTI last month and was treated by her OB-GYN. Last Thursday started having low back pain and "tingling with urination." Still has back pain, but no painful urination. No other symptoms. Warm transferred to [facility] in the practice for an appointment.  Answer Assessment - Initial Assessment Questions 1. SYMPTOM: "What's the main symptom you're concerned about?" (e.g., frequency, incontinence)     Back pain  2. ONSET: "When did the  Low back pain  start?"     Last week 3. PAIN: "Is there any pain?" If so, ask: "How bad is it?" (Scale: 1-10; mild, moderate, severe)     8 with movement 4. CAUSE: "What do you think is causing the symptoms?"     Maybe a UTI 5. OTHER SYMPTOMS: "Do you have any other symptoms?" (e.g., fever, flank pain, blood in urine, pain with urination)     Had some tingling with urination last week 6. PREGNANCY: "Is there any chance you are pregnant?" "When was your last menstrual period?"     No  Protocols used: URINARY [facility]

## 2019-04-18 ENCOUNTER — Other Ambulatory Visit: Payer: Self-pay | Admitting: Family Medicine

## 2019-04-18 DIAGNOSIS — N3000 Acute cystitis without hematuria: Secondary | ICD-10-CM

## 2019-04-18 LAB — URINE CULTURE
MICRO NUMBER:: 407135
SPECIMEN QUALITY:: ADEQUATE

## 2019-04-18 MED ORDER — AMOXICILLIN-POT CLAVULANATE 875-125 MG PO TABS: 1.0000 | ORAL_TABLET | Freq: Two times a day (BID) | ORAL | 0 refills | Status: AC

## 2019-06-13 ENCOUNTER — Telehealth: Payer: Self-pay | Admitting: Family Medicine

## 2019-06-13 NOTE — Telephone Encounter (Signed)
[facility] [phone] stated they need an order for a left ultrasound limited for the patient faxed to them for the patient's appt tomorrow at 11:00am.  Please fax it to: [phone]

## 2019-06-14 ENCOUNTER — Other Ambulatory Visit: Payer: Self-pay | Admitting: *Deleted

## 2019-06-14 DIAGNOSIS — R928 Other abnormal and inconclusive findings on diagnostic imaging of breast: Secondary | ICD-10-CM

## 2019-06-14 NOTE — Telephone Encounter (Signed)
Donnette with [facility]. Is calling again to get the status of an order needed for ultrasound for patient today.  Please advise and call her to giver her an update at [phone]

## 2019-06-14 NOTE — Telephone Encounter (Signed)
Spoke with breast center. Order placed and faxed to [facility].

## 2019-06-15 ENCOUNTER — Telehealth: Payer: Self-pay | Admitting: *Deleted

## 2019-06-15 NOTE — Telephone Encounter (Signed)
Copied from [place] [id number]. Topic: Appointment Scheduling - Scheduling Inquiry for Clinic >> Jun 15, 2019  1:38 PM Rayann Heman wrote: Reason for CRM: unable to reach office. Pt would like to schedule a physical. Please advise

## 2019-06-18 NOTE — Telephone Encounter (Signed)
Noted  

## 2019-06-18 NOTE — Telephone Encounter (Signed)
Pt has been scheduled for a physical

## 2019-06-27 ENCOUNTER — Other Ambulatory Visit: Payer: Self-pay

## 2019-06-27 ENCOUNTER — Encounter: Payer: Self-pay | Admitting: Family Medicine

## 2019-06-27 ENCOUNTER — Ambulatory Visit (INDEPENDENT_AMBULATORY_CARE_PROVIDER_SITE_OTHER): Payer: 59 | Admitting: Family Medicine

## 2019-06-27 VITALS — BP 112/62 | HR 70 | Temp 99.0°F | Wt 212.0 lb

## 2019-06-27 DIAGNOSIS — E049 Nontoxic goiter, unspecified: Secondary | ICD-10-CM | POA: Diagnosis not present

## 2019-06-27 DIAGNOSIS — Z13220 Encounter for screening for lipoid disorders: Secondary | ICD-10-CM

## 2019-06-27 DIAGNOSIS — R600 Localized edema: Secondary | ICD-10-CM

## 2019-06-27 DIAGNOSIS — Z0001 Encounter for general adult medical examination with abnormal findings: Secondary | ICD-10-CM

## 2019-06-27 DIAGNOSIS — R011 Cardiac murmur, unspecified: Secondary | ICD-10-CM | POA: Diagnosis not present

## 2019-06-27 DIAGNOSIS — R635 Abnormal weight gain: Secondary | ICD-10-CM | POA: Diagnosis not present

## 2019-06-27 DIAGNOSIS — G5601 Carpal tunnel syndrome, right upper limb: Secondary | ICD-10-CM

## 2019-06-27 DIAGNOSIS — R0683 Snoring: Secondary | ICD-10-CM

## 2019-06-27 DIAGNOSIS — Z0000 Encounter for general adult medical examination without abnormal findings: Secondary | ICD-10-CM

## 2019-06-27 NOTE — Patient Instructions (Signed)
Preventive Care 40-43 Years Old, Female Preventive care refers to visits with your health care provider and lifestyle choices that can promote health and wellness. This includes:  A yearly physical exam. This may also be called an annual well check.  Regular dental visits and eye exams.  Immunizations.  Screening for certain conditions.  Healthy lifestyle choices, such as eating a healthy diet, getting regular exercise, not using drugs or products that contain nicotine and tobacco, and limiting alcohol use. What can I expect for my preventive care visit? Physical exam Your health care provider will check your:  Height and weight. This may be used to calculate body mass index (BMI), which tells if you are at a healthy weight.  Heart rate and blood pressure.  Skin for abnormal spots. Counseling Your health care provider may ask you questions about your:  Alcohol, tobacco, and drug use.  Emotional well-being.  Home and relationship well-being.  Sexual activity.  Eating habits.  Work and work environment.  Method of birth control.  Menstrual cycle.  Pregnancy history. What immunizations do I need?  Influenza (flu) vaccine  This is recommended every year. Tetanus, diphtheria, and pertussis (Tdap) vaccine  You may need a Td booster every 10 years. Varicella (chickenpox) vaccine  You may need this if you have not been vaccinated. Zoster (shingles) vaccine  You may need this after age 60. Measles, mumps, and rubella (MMR) vaccine  You may need at least one dose of MMR if you were born in 1957 or later. You may also need a second dose. Pneumococcal conjugate (PCV13) vaccine  You may need this if you have certain conditions and were not previously vaccinated. Pneumococcal polysaccharide (PPSV23) vaccine  You may need one or two doses if you smoke cigarettes or if you have certain conditions. Meningococcal conjugate (MenACWY) vaccine  You may need this if you  have certain conditions. Hepatitis A vaccine  You may need this if you have certain conditions or if you travel or work in places where you may be exposed to hepatitis A. Hepatitis B vaccine  You may need this if you have certain conditions or if you travel or work in places where you may be exposed to hepatitis B. Haemophilus influenzae type b (Hib) vaccine  You may need this if you have certain conditions. Human papillomavirus (HPV) vaccine  If recommended by your health care provider, you may need three doses over 6 months. You may receive vaccines as individual doses or as more than one vaccine together in one shot (combination vaccines). Talk with your health care provider about the risks and benefits of combination vaccines. What tests do I need? Blood tests  Lipid and cholesterol levels. These may be checked every 5 years, or more frequently if you are over 50 years old.  Hepatitis C test.  Hepatitis B test. Screening  Lung cancer screening. You may have this screening every year starting at age 55 if you have a 30-pack-year history of smoking and currently smoke or have quit within the past 15 years.  Colorectal cancer screening. All adults should have this screening starting at age 50 and continuing until age 75. Your health care provider may recommend screening at age 45 if you are at increased risk. You will have tests every 1-10 years, depending on your results and the type of screening test.  Diabetes screening. This is done by checking your blood sugar (glucose) after you have not eaten for a while (fasting). You may have this   done every 1-3 years.  Mammogram. This may be done every 1-2 years. Talk with your health care provider about when you should start having regular mammograms. This may depend on whether you have a family history of breast cancer.  BRCA-related cancer screening. This may be done if you have a family history of breast, ovarian, tubal, or peritoneal  cancers.  Pelvic exam and Pap test. This may be done every 3 years starting at age 93. Starting at age 4, this may be done every 5 years if you have a Pap test in combination with an HPV test. Other tests  Sexually transmitted disease (STD) testing.  Bone density scan. This is done to screen for osteoporosis. You may have this scan if you are at high risk for osteoporosis. Follow these instructions at home: Eating and drinking  Eat a diet that includes fresh fruits and vegetables, whole grains, lean protein, and low-fat dairy.  Take vitamin and mineral supplements as recommended by your health care provider.  Do not drink alcohol if: ? Your health care provider tells you not to drink. ? You are pregnant, may be pregnant, or are planning to become pregnant.  If you drink alcohol: ? Limit how much you have to 0-1 drink a day. ? Be aware of how much alcohol is in your drink. In the [place] one drink equals one 12 oz bottle of beer (355 mL), one 5 oz glass of wine (148 mL), or one 1 oz glass of hard liquor (44 mL). Lifestyle  Take daily care of your teeth and gums.  Stay active. Exercise for at least 30 minutes on 5 or more days each week.  Do not use any products that contain nicotine or tobacco, such as cigarettes, e-cigarettes, and chewing tobacco. If you need help quitting, ask your health care provider.  If you are sexually active, practice safe sex. Use a condom or other form of birth control (contraception) in order to prevent pregnancy and STIs (sexually transmitted infections).  If told by your health care provider, take low-dose aspirin daily starting at age 60. What's next?  Visit your health care provider once a year for a well check visit.  Ask your health care provider how often you should have your eyes and teeth checked.  Stay up to date on all vaccines. This information is not intended to replace advice given to you by your health care provider. Make sure you  discuss any questions you have with your health care provider. Document Released: 01/09/2016 Document Revised: 08/24/2018 Document Reviewed: 08/24/2018 Elsevier Patient Education  [place]  A goiter is an enlarged thyroid gland. The thyroid is located in the lower front of the neck. It makes hormones that affect many body parts and systems, including the system that affects how quickly the body burns fuel for energy (metabolism). Most goiters are painless and are not a cause for concern. Some goiters can affect the way your thyroid makes thyroid hormones. Goiters and conditions that cause goiters can be treated, if necessary. What are the causes? Common causes of this condition include:  Lack (deficiency) of a mineral called iodine. The thyroid gland uses iodine to make thyroid hormones.  Diseases that attack healthy cells in the body (autoimmune diseases) and affect thyroid function, such as Graves' disease or Hashimoto's disease. These diseases may cause the body to produce too much thyroid hormone (hyperthyroidism) or too little of the hormone (hypothyroidism).  Conditions that cause inflammation of the thyroid (  thyroiditis).  One or more small growths on the thyroid (nodular goiter). Other causes include:  Medical problems caused by abnormal genes that are passed from parent to child (genetic defects).  Thyroid injury or infection.  Tumors that may or may not be cancerous.  Pregnancy.  Certain medicines.  Exposure to radiation. In some cases, the cause may not be known. What increases the risk? This condition is more likely to develop in:  People who do not get enough iodine in their diet.  People who have a family history of goiter.  Women.  People who are older than age 65.  People who smoke tobacco.  People who have had exposure to radiation. What are the signs or symptoms? The main symptom of this condition is swelling in the lower, front part  of the neck. This swelling can range from a very small bump to a large lump. Other symptoms may include:  A tight feeling in the throat.  A hoarse voice.  Coughing.  Wheezing.  Difficulty swallowing or breathing.  Bulging veins in the neck.  Dizziness. When a goiter is the result of an overactive thyroid (hyperthyroidism), symptoms may also include:  Nervousness or restlessness.  Inability to tolerate heat.  Unexplained weight loss.  Diarrhea.  Change in the texture of hair or skin.  Changes in heartbeat, such as skipped beats, extra beats, or a rapid heart rate.  Loss of menstruation.  Shaky hands.  Increased appetite.  Sleep problems. When a goiter is the result of an underactive thyroid (hypothyroidism), symptoms may also include:  Feeling like you have no energy (lethargy).  Inability to tolerate cold.  Weight gain that is not explained by a change in diet or exercise habits.  Dry skin.  Coarse hair.  Irregular menstrual periods.  Constipation.  Sadness or depression.  Fatigue. In some cases, there may not be any symptoms and the thyroid hormone levels may be normal. How is this diagnosed? This condition may be diagnosed based on your symptoms, your medical history, and a physical exam. You may have tests, such as:  Blood tests to check thyroid function.  Imaging tests, such as: ? Ultrasound. ? CT scan. ? MRI. ? Thyroid scan.  Removal of a tissue sample (biopsy) of the goiter or any nodules. The sample will be tested to check for cancer. How is this treated? Treatment for this condition depends on the cause and your symptoms. Treatment may include:  Medicines to regulate thyroid hormone levels.  Anti-inflammatory medicines or steroid medicines, if the goiter is caused by inflammation.  Iodine supplements or changes to your diet, if the goiter is caused by iodine deficiency.  Radioactive iodine treatment.  Surgery to remove your  thyroid. In some cases, you may only need regular check-ups with your health care provider to monitor your condition, and you may not need treatment. Follow these instructions at home:  Follow instructions from your health care provider about any changes to your diet.  Take over-the-counter and prescription medicines only as told by your health care provider. These include supplements.  Do not use any products that contain nicotine or tobacco, such as cigarettes and e-cigarettes. If you need help quitting, ask your health care provider.  Keep all follow-up visits as told by your health care provider. This is important. Contact a health care provider if:  Your symptoms do not get better with treatment.  You have nausea, vomiting, or diarrhea. Get help right away if:  You have sudden, unexplained confusion  or other mental changes.  You have a fever.  You have chest pain.  You have trouble breathing or swallowing.  You suddenly become very weak.  You experience extreme restlessness.  You feel your heart racing. Summary  A goiter is an enlarged thyroid gland.  The thyroid gland is located in the lower front of the neck. It makes hormones that affect many body parts and systems, including the system that affects how quickly the body burns fuel for energy (metabolism).  The main symptom of this condition is swelling in the lower, front part of the neck. This swelling can range from a very small bump to a large lump.  Treatment for this condition depends on the cause and your symptoms. You may need medicines, supplements, or regular monitoring of your condition. This information is not intended to replace advice given to you by your health care provider. Make sure you discuss any questions you have with your health care provider. Document Released: 06/02/2010 Document Revised: 11/25/2017 Document Reviewed: 09/08/2017 Elsevier Patient Education  2020 Medulla tunnel syndrome is a condition that causes pain in your hand and arm. The carpal tunnel is a narrow area located on the palm side of your wrist. Repeated wrist motion or certain diseases may cause swelling within the tunnel. This swelling pinches the main nerve in the wrist (median nerve). What are the causes? This condition may be caused by:  Repeated wrist motions.  Wrist injuries.  Arthritis.  A cyst or tumor in the carpal tunnel.  Fluid buildup during pregnancy. Sometimes the cause of this condition is not known. What increases the risk? The following factors may make you more likely to develop this condition:  Having a job, such as being a Research scientist (life sciences), that requires you to repeatedly move your wrist in the same motion.  Being a woman.  Having certain conditions, such as: ? Diabetes. ? Obesity. ? An underactive thyroid (hypothyroidism). ? Kidney failure. What are the signs or symptoms? Symptoms of this condition include:  A tingling feeling in your fingers, especially in your thumb, index, and middle fingers.  Tingling or numbness in your hand.  An aching feeling in your entire arm, especially when your wrist and elbow are bent for a long time.  Wrist pain that goes up your arm to your shoulder.  Pain that goes down into your palm or fingers.  A weak feeling in your hands. You may have trouble grabbing and holding items. Your symptoms may feel worse during the night. How is this diagnosed? This condition is diagnosed with a medical history and physical exam. You may also have tests, including:  Electromyogram (EMG). This test measures electrical signals sent by your nerves into the muscles.  Nerve conduction study. This test measures how well electrical signals pass through your nerves.  Imaging tests, such as X-rays, ultrasound, and MRI. These tests check for possible causes of your condition. How is this treated? This condition may  be treated with:  Lifestyle changes. It is important to stop or change the activity that caused your condition.  Doing exercise and activities to strengthen your muscles and bones (physical therapy).  Learning how to use your hand again after diagnosis (occupational therapy).  Medicines for pain and inflammation. This may include medicine that is injected into your wrist.  A wrist splint.  Surgery. Follow these instructions at home: If you have a splint:  Wear the splint as told  by your health care provider. Remove it only as told by your health care provider.  Loosen the splint if your fingers tingle, become numb, or turn cold and blue.  Keep the splint clean.  If the splint is not waterproof: ? Do not let it get wet. ? Cover it with a watertight covering when you take a bath or shower. Managing pain, stiffness, and swelling   If directed, put ice on the painful area: ? If you have a removable splint, remove it as told by your health care provider. ? Put ice in a plastic bag. ? Place a towel between your skin and the bag. ? Leave the ice on for 20 minutes, 2-3 times per day. General instructions  Take over-the-counter and prescription medicines only as told by your health care provider.  Rest your wrist from any activity that may be causing your pain. If your condition is work related, talk with your employer about changes that can be made, such as getting a wrist pad to use while typing.  Do any exercises as told by your health care provider, physical therapist, or occupational therapist.  Keep all follow-up visits as told by your health care provider. This is important. Contact a health care provider if:  You have new symptoms.  Your pain is not controlled with medicines.  Your symptoms get worse. Get help right away if:  You have severe numbness or tingling in your wrist or hand. Summary  Carpal tunnel syndrome is a condition that causes pain in your hand and  arm.  It is usually caused by repeated wrist motions.  Lifestyle changes and medicines are used to treat carpal tunnel syndrome. Surgery may be recommended.  Follow your health care provider's instructions about wearing a splint, resting from activity, keeping follow-up visits, and calling for help. This information is not intended to replace advice given to you by your health care provider. Make sure you discuss any questions you have with your health care provider. Document Released: 12/10/2000 Document Revised: 04/21/2018 Document Reviewed: 04/21/2018 Elsevier Patient Education  2020 [employer].

## 2019-06-27 NOTE — Progress Notes (Signed)
Subjective:     Emily Myers is a 43 y.o. female and is here for a comprehensive physical exam. The patient reports problems - R wrist pain, LE edema, wt gain, R knee injury, and respiratory issue.  Pt has a form that needs completing for [employer].  Pt is transferring from a mainly online program at [facility] to [employer].    R wrist pain: -concern about carpal tunnel -was wearing a wrist splint during the day. -endorses numbness/tingling in R thumb and fingers -pain does not wake her up at night, though may wake in the morning with it.  LE edema -notices off and on  -has more edema when standing doing hair -started wearing TED hose. -trying to reduce sodium intake. -denies SOB, CP, dizziness, pain in LEs  Weight gain -pt endorses gaining weight since being at home 2/2 COVID-19 pandemic -states has not been exercising -husband has been cooking daily. -notes making loud noises when breathing.  Notices more in the evening/at night.  Unsure if she is wheezing. -also told she snores  R knee injury: -notes happened yrs ago when she ran track -seen by [facility]? In the past -had fluid removed -told cartilage was thin  Social History   Socioeconomic History  . Marital status: Married    Spouse name: Not on file  . Number of children: 2  . Years of education: Not on file  . Highest education level: Not on file  Occupational History  . Not on file  Social Needs  . Financial resource strain: Not on file  . Food insecurity    Worry: Not on file    Inability: Not on file  . Transportation needs    Medical: Not on file    Non-medical: Not on file  Tobacco Use  . Smoking status: Never Smoker  . Smokeless tobacco: Never Used  Substance and Sexual Activity  . Alcohol use: Yes    Comment: social wine  . Drug use: No  . Sexual activity: Yes    Birth control/protection: Pill  Lifestyle  . Physical activity    Days per week: Not on file    Minutes per session: Not on file   . Stress: Not on file  Relationships  . Social Herbalist on phone: Not on file    Gets together: Not on file    Attends religious service: Not on file    Active member of club or organization: Not on file    Attends meetings of clubs or organizations: Not on file    Relationship status: Not on file  . Intimate partner violence    Fear of current or ex partner: Not on file    Emotionally abused: Not on file    Physically abused: Not on file    Forced sexual activity: Not on file  Other Topics Concern  . Not on file  Social History Narrative   43 yo AAF who lives at home with her stepmom and her 2 daughters ages 42 and 74. She is a never smoker, social alcohol drinker described as 1-2 drinks a month, denies illicit drug use. She has some college education and works as a Loss adjuster, chartered at [employer]. She recently moved from [place] to [place] 4 months ago and has no PCP or OB/GYN established.   Health Maintenance  Topic Date Due  . TETANUS/TDAP  10/20/1995  . PAP SMEAR-Modifier  10/19/1997  . INFLUENZA VACCINE  07/28/2019  . HIV  Screening  Completed    The following portions of the patient's history were reviewed and updated as appropriate: allergies, current medications, past family history, past medical history, past social history, past surgical history and problem list.  Review of Systems Pertinent items noted in HPI and remainder of comprehensive ROS otherwise negative.   Objective:    BP 112/62   Pulse 70   Temp 99 F (37.2 C) (Oral)   Wt 212 lb (96.2 kg)   SpO2 98%   BMI 38.78 kg/m  General appearance: alert, cooperative and no distress Head: Normocephalic, without obvious abnormality, atraumatic Eyes: conjunctivae/corneas clear. PERRL, EOM's intact. Fundi benign. Ears: normal TM's and external ear canals both ears Nose: Nares normal. Septum midline. Mucosa normal. No drainage or sinus tenderness. Throat: lips, mucosa, and tongue  normal; teeth and gums normal Neck: no adenopathy, no carotid bruit, no JVD, supple, symmetrical, trachea midline and R lobe of thyroid enlarged, L lobe normal, no tenderness/mass/nodules Lungs: clear to auscultation bilaterally Heart: regular rate and rhythm, 2/6 murmur, no click, rub or gallop.  No peripheral edema noted. Abdomen: soft, non-tender; bowel sounds normal; no masses,  no organomegaly Extremities: extremities normal, atraumatic, no cyanosis or edema  MSK: no TTP of b/l wrists, hands, or fingers.  Negative Tinnel's and Phalen's.  No edema, erythema, or deformity noted. Pulses: 2+ and symmetric Skin: Skin color, texture, turgor normal. No rashes or lesions Lymph nodes: Cervical, supraclavicular, and axillary nodes normal. Neurologic: Alert and oriented X 3, normal strength and tone. Normal symmetric reflexes. Normal coordination and gait    Assessment:    Healthy female exam with several complaints  Plan:     Anticipatory guidance given including wearing seatbelts, smoke detectors in the home, increasing physical activity, increasing p.o. intake of water and vegetables. -will obtain labs -pap up to date -given handout -mammogram up to date.  Repeat due Oct 2020 with [facility] in [place].  Will need order sent. -will complete form when labs result and copy of immunizations obtained. See After Visit Summary for Counseling Recommendations    Carpal tunnel syndrome of right wrist -discussed various treatment options.  Pt to wear cock-up wrist splint at night, NSAIDs prn, heat, ice, exercises -discussed ergonomics -if symptoms continue discussed EMG/NCS and Ortho referral  Heart murmur  -stable -seen by Cardiology, Dr. Johnsie Cancel.  F/u prn  Bilateral lower extremity edema -discussed continue to wear TED hose and reducing sodium intake -elevate feet when sitting.  - Plan: Brain Natriuretic Peptide  Goiter  - Plan: TSH, T4, Free - consider thyroid u/s  Screening  for cholesterol level  - Plan: Lipid panel  Weight gain  -discussed increasing physical activity and decreasing calories. - Plan: Hemoglobin A1c  Snoring  -discussed sleep study - Plan: Ambulatory referral to Pulmonology  F/u prn in the next few months.  Grier Mitts, MD

## 2019-06-28 LAB — LIPID PANEL
Cholesterol: 196 mg/dL (ref 0–200)
HDL: 58.9 mg/dL (ref >39.00)
LDL Cholesterol: 122 mg/dL — ABNORMAL HIGH (ref 0–99)
NonHDL: 136.74
Total CHOL/HDL Ratio: 3
Triglycerides: 73 mg/dL (ref 0.0–149.0)
VLDL: 14.6 mg/dL (ref 0.0–40.0)

## 2019-06-28 LAB — CBC WITH DIFFERENTIAL/PLATELET
Basophils Absolute: 0.1 10*3/uL (ref 0.0–0.1)
Basophils Relative: 1.3 % (ref 0.0–3.0)
Eosinophils Absolute: 0.3 10*3/uL (ref 0.0–0.7)
Eosinophils Relative: 5.1 % — ABNORMAL HIGH (ref 0.0–5.0)
HCT: 36.8 % (ref 36.0–46.0)
Hemoglobin: 11.7 g/dL — ABNORMAL LOW (ref 12.0–15.0)
Lymphocytes Relative: 29.7 % (ref 12.0–46.0)
Lymphs Abs: 1.8 10*3/uL (ref 0.7–4.0)
MCHC: 31.6 g/dL (ref 30.0–36.0)
MCV: 85 fl (ref 78.0–100.0)
Monocytes Absolute: 0.6 10*3/uL (ref 0.1–1.0)
Monocytes Relative: 9.5 % (ref 3.0–12.0)
Neutro Abs: 3.4 10*3/uL (ref 1.4–7.7)
Neutrophils Relative %: 54.4 % (ref 43.0–77.0)
Platelets: 355 10*3/uL (ref 150.0–400.0)
RBC: 4.34 Mil/uL (ref 3.87–5.11)
RDW: 14.1 % (ref 11.5–15.5)
WBC: 6.2 10*3/uL (ref 4.0–10.5)

## 2019-06-28 LAB — BASIC METABOLIC PANEL
BUN: 15 mg/dL (ref 6–23)
CO2: 25 mEq/L (ref 19–32)
Calcium: 9.3 mg/dL (ref 8.4–10.5)
Chloride: 103 mEq/L (ref 96–112)
Creatinine, Ser: 0.75 mg/dL (ref 0.40–1.20)
GFR: 102.2 mL/min (ref >60.00)
Glucose, Bld: 75 mg/dL (ref 70–99)
Potassium: 4.2 mEq/L (ref 3.5–5.1)
Sodium: 137 mEq/L (ref 135–145)

## 2019-06-28 LAB — T4, FREE: Free T4: 0.75 ng/dL (ref 0.60–1.60)

## 2019-06-28 LAB — TSH: TSH: 1.65 u[IU]/mL (ref 0.35–4.50)

## 2019-06-28 LAB — HEMOGLOBIN A1C: Hgb A1c MFr Bld: 5.2 % (ref 4.6–6.5)

## 2019-06-28 LAB — BRAIN NATRIURETIC PEPTIDE: Pro B Natriuretic peptide (BNP): 16 pg/mL (ref 0.0–100.0)

## 2019-11-08 ENCOUNTER — Telehealth: Payer: Self-pay | Admitting: *Deleted

## 2019-11-08 DIAGNOSIS — R928 Other abnormal and inconclusive findings on diagnostic imaging of breast: Secondary | ICD-10-CM

## 2019-11-08 NOTE — Telephone Encounter (Signed)
Copied from [place] [id number]. Topic: Referral - Request for Referral >> Nov 08, 2019  3:58 PM Scherrie Gerlach wrote: Jannifer Hick with [facility] requesting order for bilateral 3 D mammogram , order faxed this am.  Pt has appt at 10 am tomorrow and they need back asap.  [phone] Fax  [phone]

## 2019-11-08 NOTE — Telephone Encounter (Signed)
Order placed and faxed over to [facility].

## 2019-11-09 ENCOUNTER — Other Ambulatory Visit: Payer: Self-pay

## 2019-11-09 DIAGNOSIS — N6320 Unspecified lump in the left breast, unspecified quadrant: Secondary | ICD-10-CM

## 2019-11-09 DIAGNOSIS — R928 Other abnormal and inconclusive findings on diagnostic imaging of breast: Secondary | ICD-10-CM

## 2019-11-09 LAB — HM MAMMOGRAPHY

## 2019-11-19 ENCOUNTER — Encounter: Payer: Self-pay | Admitting: Family Medicine

## 2020-01-02 ENCOUNTER — Ambulatory Visit (INDEPENDENT_AMBULATORY_CARE_PROVIDER_SITE_OTHER): Payer: 59 | Admitting: Pulmonary Disease

## 2020-01-02 ENCOUNTER — Encounter: Payer: Self-pay | Admitting: Pulmonary Disease

## 2020-01-02 ENCOUNTER — Other Ambulatory Visit: Payer: Self-pay

## 2020-01-02 VITALS — BP 128/62 | HR 58 | Ht 62.5 in | Wt 218.0 lb

## 2020-01-02 DIAGNOSIS — R0683 Snoring: Secondary | ICD-10-CM | POA: Diagnosis not present

## 2020-01-02 NOTE — Patient Instructions (Signed)
Moderate probability of significant obstructive sleep apnea  We will schedule you for home sleep study Inform you when results available  CPAP will be an option of treatment  Continue weight loss efforts Sleep Apnea Sleep apnea is a condition in which breathing pauses or becomes shallow during sleep. Episodes of sleep apnea usually last 10 seconds or longer, and they may occur as many as 20 times an hour. Sleep apnea disrupts your sleep and keeps your body from getting the rest that it needs. This condition can increase your risk of certain health problems, including:  Heart attack.  Stroke.  Obesity.  Diabetes.  Heart failure.  Irregular heartbeat. What are the causes? There are three kinds of sleep apnea:  Obstructive sleep apnea. This kind is caused by a blocked or collapsed airway.  Central sleep apnea. This kind happens when the part of the brain that controls breathing does not send the correct signals to the muscles that control breathing.  Mixed sleep apnea. This is a combination of obstructive and central sleep apnea. The most common cause of this condition is a collapsed or blocked airway. An airway can collapse or become blocked if:  Your throat muscles are abnormally relaxed.  Your tongue and tonsils are larger than normal.  You are overweight.  Your airway is smaller than normal. What increases the risk? You are more likely to develop this condition if you:  Are overweight.  Smoke.  Have a smaller than normal airway.  Are elderly.  Are female.  Drink alcohol.  Take sedatives or tranquilizers.  Have a family history of sleep apnea. What are the signs or symptoms? Symptoms of this condition include:  Trouble staying asleep.  Daytime sleepiness and tiredness.  Irritability.  Loud snoring.  Morning headaches.  Trouble concentrating.  Forgetfulness.  Decreased interest in sex.  Unexplained sleepiness.  Mood swings.  Personality  changes.  Feelings of depression.  Waking up often during the night to urinate.  Dry mouth.  Sore throat. How is this diagnosed? This condition may be diagnosed with:  A medical history.  A physical exam.  A series of tests that are done while you are sleeping (sleep study). These tests are usually done in a sleep lab, but they may also be done at home. How is this treated? Treatment for this condition aims to restore normal breathing and to ease symptoms during sleep. It may involve managing health issues that can affect breathing, such as high blood pressure or obesity. Treatment may include:  Sleeping on your side.  Using a decongestant if you have nasal congestion.  Avoiding the use of depressants, including alcohol, sedatives, and narcotics.  Losing weight if you are overweight.  Making changes to your diet.  Quitting smoking.  Using a device to open your airway while you sleep, such as: ? An oral appliance. This is a custom-made mouthpiece that shifts your lower jaw forward. ? A continuous positive airway pressure (CPAP) device. This device blows air through a mask when you breathe out (exhale). ? A nasal expiratory positive airway pressure (EPAP) device. This device has valves that you put into each nostril. ? A bi-level positive airway pressure (BPAP) device. This device blows air through a mask when you breathe in (inhale) and breathe out (exhale).  Having surgery if other treatments do not work. During surgery, excess tissue is removed to create a wider airway. It is important to get treatment for sleep apnea. Without treatment, this condition can lead to:  High blood pressure.  Coronary artery disease.  In men, an inability to achieve or maintain an erection (impotence).  Reduced thinking abilities. Follow these instructions at home: Lifestyle  Make any lifestyle changes that your health care provider recommends.  Eat a healthy, well-balanced  diet.  Take steps to lose weight if you are overweight.  Avoid using depressants, including alcohol, sedatives, and narcotics.  Do not use any products that contain nicotine or tobacco, such as cigarettes, e-cigarettes, and chewing tobacco. If you need help quitting, ask your health care provider. General instructions  Take over-the-counter and prescription medicines only as told by your health care provider.  If you were given a device to open your airway while you sleep, use it only as told by your health care provider.  If you are having surgery, make sure to tell your health care provider you have sleep apnea. You may need to bring your device with you.  Keep all follow-up visits as told by your health care provider. This is important. Contact a health care provider if:  The device that you received to open your airway during sleep is uncomfortable or does not seem to be working.  Your symptoms do not improve.  Your symptoms get worse. Get help right away if:  You develop: ? Chest pain. ? Shortness of breath. ? Discomfort in your back, arms, or stomach.  You have: ? Trouble speaking. ? Weakness on one side of your body. ? Drooping in your face. These symptoms may represent a serious problem that is an emergency. Do not wait to see if the symptoms will go away. Get medical help right away. Call your local emergency services (911 in the [place] Do not drive yourself to the hospital. Summary  Sleep apnea is a condition in which breathing pauses or becomes shallow during sleep.  The most common cause is a collapsed or blocked airway.  The goal of treatment is to restore normal breathing and to ease symptoms during sleep. This information is not intended to replace advice given to you by your health care provider. Make sure you discuss any questions you have with your health care provider. Document Revised: 05/30/2019 Document Reviewed: 08/08/2018 Elsevier Patient Education   [place].

## 2020-01-02 NOTE — Progress Notes (Signed)
Subjective:    Patient ID: Emily Myers, female    DOB: 20-Jan-1976, 44 y.o.   MRN: [id number]  Patient being seen for history of snoring, wheezing  Significant weight gain recently since Covid Has gained about 30 pounds  Usually goes to bed between 10 and 12 midnight Takes about 10 to 15 minutes to fall asleep Awakenings to use the bathroom about 1-2 times Final wake up time between 6:30 AM and 7 AM, an hour later over the weekend  No dryness of her mouth Occasional headaches No OSA in the family No memory issues  Never smoker  Past Medical History:  Diagnosis Date  . Anemia   . Blood transfusion without reported diagnosis 06/2018   [facility] - 2 units transfused  . Endometrial polyp    Benign  . Heart murmur    as a child, never has caused any problems  . HSV infection   . SVD (spontaneous vaginal delivery)    x 2   Family History  Problem Relation Age of Onset  . Eczema Mother    Social History   Socioeconomic History  . Marital status: Married    Spouse name: Not on file  . Number of children: 2  . Years of education: Not on file  . Highest education level: Not on file  Occupational History  . Not on file  Tobacco Use  . Smoking status: Never Smoker  . Smokeless tobacco: Never Used  Substance and Sexual Activity  . Alcohol use: Yes    Comment: social wine  . Drug use: No  . Sexual activity: Yes    Birth control/protection: Pill  Other Topics Concern  . Not on file  Social History Narrative   44 yo AAF who lives at home with her stepmom and her 2 daughters ages 42 and 45. She is a never smoker, social alcohol drinker described as 1-2 drinks a month, denies illicit drug use. She has some college education and works as a Loss adjuster, chartered at [employer]. She recently moved from [place] to [place] 4 months ago and has no PCP or OB/GYN established.   Social Determinants of Health   Financial Resource Strain:   . Difficulty of  Paying Living Expenses: Not on file  Food Insecurity:   . Worried About Charity fundraiser in the Last Year: Not on file  . Ran Out of Food in the Last Year: Not on file  Transportation Needs:   . Lack of Transportation (Medical): Not on file  . Lack of Transportation (Non-Medical): Not on file  Physical Activity:   . Days of Exercise per Week: Not on file  . Minutes of Exercise per Session: Not on file  Stress:   . Feeling of Stress : Not on file  Social Connections:   . Frequency of Communication with Friends and Family: Not on file  . Frequency of Social Gatherings with Friends and Family: Not on file  . Attends Religious Services: Not on file  . Active Member of Clubs or Organizations: Not on file  . Attends Archivist Meetings: Not on file  . Marital Status: Not on file  Intimate Partner Violence:   . Fear of Current or Ex-Partner: Not on file  . Emotionally Abused: Not on file  . Physically Abused: Not on file  . Sexually Abused: Not on file   Review of Systems  Constitutional: Positive for unexpected weight change. Negative for fever.  HENT: Negative for  congestion, dental problem, ear pain, nosebleeds, postnasal drip, rhinorrhea, sinus pressure, sneezing, sore throat and trouble swallowing.   Eyes: Negative for redness and itching.  Respiratory: Positive for shortness of breath. Negative for cough, chest tightness and wheezing.   Cardiovascular: Negative for palpitations and leg swelling.  Gastrointestinal: Negative for nausea and vomiting.  Genitourinary: Negative for dysuria.  Musculoskeletal: Negative for joint swelling.  Skin: Negative for rash.  Allergic/Immunologic: Positive for environmental allergies. Negative for food allergies and immunocompromised state.  Neurological: Positive for headaches.  Hematological: Does not bruise/bleed easily.  Psychiatric/Behavioral: Negative for dysphoric mood. The patient is not nervous/anxious.        Objective:     Physical Exam Vitals:   01/02/20 1617  BP: 128/62  Pulse: (!) 58  SpO2: 97%   Results of the Epworth flowsheet 01/02/2020  Sitting and reading 2  Watching TV 2  Sitting, inactive in a public place (e.g. a theatre or a meeting) 1  As a passenger in a car for an hour without a break 2  Lying down to rest in the afternoon when circumstances permit 3  Sitting and talking to someone 0  Sitting quietly after a lunch without alcohol 1  In a car, while stopped for a few minutes in traffic 0  Total score 11      Assessment & Plan:  .  Moderate probability of significant obstructive sleep apnea  .  Excessive daytime sleepiness -Related to untreated obstructive sleep apnea  .  Obesity -Likely contributing to obstructive sleep apnea  .  Wheezing/shortness of breath -Likely related to deconditioning -Significant weight gain recently  Pathophysiology of sleep disordered breathing discussed Treatment options for sleep disordered breathing discussed  Plan: Schedule patient for a home sleep study  Encouraged regarding increasing physical activity for weight loss  We will see her in 8 weeks  Call with significant concerns

## 2020-02-05 ENCOUNTER — Other Ambulatory Visit: Payer: Self-pay

## 2020-02-06 ENCOUNTER — Encounter: Payer: Self-pay | Admitting: Family Medicine

## 2020-02-06 ENCOUNTER — Ambulatory Visit (INDEPENDENT_AMBULATORY_CARE_PROVIDER_SITE_OTHER): Payer: 59 | Admitting: Family Medicine

## 2020-02-06 VITALS — BP 102/76 | HR 92 | Temp 97.8°F | Wt 219.0 lb

## 2020-02-06 DIAGNOSIS — F4321 Adjustment disorder with depressed mood: Secondary | ICD-10-CM

## 2020-02-06 DIAGNOSIS — R635 Abnormal weight gain: Secondary | ICD-10-CM

## 2020-02-06 DIAGNOSIS — R600 Localized edema: Secondary | ICD-10-CM

## 2020-02-06 NOTE — Progress Notes (Signed)
Subjective:    Patient ID: Emily Myers, female    DOB: 02/04/1976, 44 y.o.   MRN: [id number]  No chief complaint on file.   HPI Patient was seen today for acute concern.  Pt buried her mother on Friday.  She was 92 yo.  Pt states the COD was coronary vascular disease, however pt notes prior to her mom's death she was experiencing LE edema/pain.  Pt's mom was found down at her home in [facility] [place] [facility] during a welfare check.  Pt states she was doing well until all of this happened.  Pt is considering counseling as she and her mother did not have the closest relationship.  Pt's mother had a h/o bipolar d/o, COPD, tobacco use, EtOH use.  Pt also mentions she wants to lose weight.  Doing a lot of sitting/standing throughout the day.  At times has LE edema/R knee edema, especially if stands to braid hair.  Will wear compression socks then.  Edema resolves by the next morning.  Past Medical History:  Diagnosis Date  . Anemia   . Blood transfusion without reported diagnosis 06/2018   [facility] - 2 units transfused  . Endometrial polyp    Benign  . Heart murmur    as a child, never has caused any problems  . HSV infection   . SVD (spontaneous vaginal delivery)    x 2    Allergies  Allergen Reactions  . Aspirin Hives    ROS General: Denies fever, chills, night sweats, changes in weight, changes in appetite HEENT: Denies headaches, ear pain, changes in vision, rhinorrhea, sore throat CV: Denies CP, palpitations, SOB, orthopnea Pulm: Denies SOB, cough, wheezing GI: Denies abdominal pain, nausea, vomiting, diarrhea, constipation GU: Denies dysuria, hematuria, frequency, vaginal discharge Msk: Denies muscle cramps, joint pains  +intermittent LE edema, R knee edema Neuro: Denies weakness, numbness, tingling Skin: Denies rashes, bruising Psych: Denies depression, anxiety, hallucinations     Objective:    Blood pressure 102/76, pulse 92, temperature 97.8 F (36.6 C), temperature source Temporal,  weight 219 lb (99.3 kg), SpO2 96 %, unknown if currently breastfeeding.  Gen. Pleasant, well-nourished, in no distress, normal affect   HEENT: [place]/AT, face symmetric, no scleral icterus, PERRLA, EOMI, nares patent without drainage Lungs: no accessory muscle use Cardiovascular: RRR, no peripheral edema Musculoskeletal: No deformities, no cyanosis or clubbing, normal tone Neuro:  A&Ox3, CN II-XII intact, normal gait Skin:  Warm, no lesions/ rash  Wt Readings from Last 3 Encounters:  01/02/20 218 lb (98.9 kg)  06/27/19 212 lb (96.2 kg)  12/14/18 205 lb (93 kg)    Lab Results  Component Value Date   WBC 6.2 06/27/2019   HGB 11.7 (L) 06/27/2019   HCT 36.8 06/27/2019   PLT 355.0 06/27/2019   GLUCOSE 75 06/27/2019   CHOL 196 06/27/2019   TRIG 73.0 06/27/2019   HDL 58.90 06/27/2019   LDLCALC 122 (H) 06/27/2019   ALT 12 07/04/2018   AST 14 (L) 08/11/2018   NA 137 06/27/2019   K 4.2 06/27/2019   CL 103 06/27/2019   CREATININE 0.75 06/27/2019   BUN 15 06/27/2019   CO2 25 06/27/2019   TSH 1.65 06/27/2019   HGBA1C 5.2 06/27/2019    Assessment/Plan:  Grief -discussed counseling options -given list of area providers -given handouts  Weight gain  -given handouts -discussed various ways to lose weight: portion sizes, increasing physical activity, etc  Bilateral Lower extremity edema -dependent edema -discussed compression socks, TED hose, reducing sodium intake,  elevating LEs. -will continue to monitor.  F/u prn  Grier Mitts, MD

## 2020-02-06 NOTE — Patient Instructions (Signed)
Managing Loss, Adult People experience loss in many different ways throughout their lives. Events such as moving, changing jobs, and losing friends can create a sense of loss. The loss may be as serious as a major health change, divorce, death of a pet, or death of a loved one. All of these types of loss are likely to create a physical and emotional reaction known as grief. Grief is the result of a major change or an absence of something or someone that you count on. Grief is a normal reaction to loss. A variety of factors can affect your grieving experience, including:  The nature of your loss.  Your relationship to what or whom you lost.  Your understanding of grief and how to manage it.  Your support system. How to manage lifestyle changes Keep to your normal routine as much as possible.  If you have trouble focusing or doing normal activities, it is acceptable to take some time away from your normal routine.  Spend time with friends and loved ones.  Eat a healthy diet, get plenty of sleep, and rest when you feel tired. How to recognize changes  The way that you deal with your grief will affect your ability to function as you normally do. When grieving, you may experience these changes:  Numbness, shock, sadness, anxiety, anger, denial, and guilt.  Thoughts about death.  Unexpected crying.  A physical sensation of emptiness in your stomach.  Problems sleeping and eating.  Tiredness (fatigue).  Loss of interest in normal activities.  Dreaming about or imagining seeing the person who died.  A need to remember what or whom you lost.  Difficulty thinking about anything other than your loss for a period of time.  Relief. If you have been expecting the loss for a while, you may feel a sense of relief when it happens. Follow these instructions at home:  Activity Express your feelings in healthy ways, such as:  Talking with others about your loss. It may be helpful to find  others who have had a similar loss, such as a support group.  Writing down your feelings in a journal.  Doing physical activities to release stress and emotional energy.  Doing creative activities like painting, sculpting, or playing or listening to music.  Practicing resilience. This is the ability to recover and adjust after facing challenges. Reading some resources that encourage resilience may help you to learn ways to practice those behaviors. General instructions  Be patient with yourself and others. Allow the grieving process to happen, and remember that grieving takes time. ? It is likely that you may never feel completely done with some grief. You may find a way to move on while still cherishing memories and feelings about your loss. ? Accepting your loss is a process. It can take months or longer to adjust.  Keep all follow-up visits as told by your health care provider. This is important. Where to find support To get support for managing loss:  Ask your health care provider for help and recommendations, such as grief counseling or therapy.  Think about joining a support group for people who are managing a loss. Where to find more information You can find more information about managing loss from:  American Society of Clinical Oncology: www.cancer.net  American Psychological Association: www.apa.org Contact a health care provider if:  Your grief is extreme and keeps getting worse.  You have ongoing grief that does not improve.  Your body shows symptoms of grief, such   as illness.  You feel depressed, anxious, or lonely. Get help right away if:  You have thoughts about hurting yourself or others. If you ever feel like you may hurt yourself or others, or have thoughts about taking your own life, get help right away. You can go to your nearest emergency department or call:  Your local emergency services (911 in the [place]  A suicide crisis helpline, such as the  [place] at 914-073-3116. This is open 24 hours a day. Summary  Grief is the result of a major change or an absence of someone or something that you count on. Grief is a normal reaction to loss.  The depth of grief and the period of recovery depend on the type of loss and your ability to adjust to the change and process your feelings.  Processing grief requires patience and a willingness to accept your feelings and talk about your loss with people who are supportive.  It is important to find resources that work for you and to realize that people experience grief differently. There is not one grieving process that works for everyone in the same way.  Be aware that when grief becomes extreme, it can lead to more severe issues like isolation, depression, anxiety, or suicidal thoughts. Talk with your health care provider if you have any of these issues. This information is not intended to replace advice given to you by your health care provider. Make sure you discuss any questions you have with your health care provider. Document Revised: 02/16/2019 Document Reviewed: 04/28/2017 Elsevier Patient Education  [place] [employer] Loss Calories are units of energy. Your body needs a certain amount of calories from food to keep you going throughout the day. When you eat more calories than your body needs, your body stores the extra calories as fat. When you eat fewer calories than your body needs, your body burns fat to get the energy it needs. Calorie counting means keeping track of how many calories you eat and drink each day. Calorie counting can be helpful if you need to lose weight. If you make sure to eat fewer calories than your body needs, you should lose weight. Ask your health care provider what a healthy weight is for you. For calorie counting to work, you will need to eat the right number of calories in a day in order to lose a healthy  amount of weight per week. A dietitian can help you determine how many calories you need in a day and will give you suggestions on how to reach your calorie goal.  A healthy amount of weight to lose per week is usually 1-2 lb (0.5-0.9 kg). This usually means that your daily calorie intake should be reduced by 500-750 calories.  Eating 1,200 - 1,500 calories per day can help most women lose weight.  Eating 1,500 - 1,800 calories per day can help most men lose weight. What is my plan? My goal is to have __________ calories per day. If I have this many calories per day, I should lose around __________ pounds per week. What do I need to know about calorie counting? In order to meet your daily calorie goal, you will need to:  Find out how many calories are in each food you would like to eat. Try to do this before you eat.  Decide how much of the food you plan to eat.  Write down what you ate and how many calories  it had. Doing this is called keeping a food log. To successfully lose weight, it is important to balance calorie counting with a healthy lifestyle that includes regular activity. Aim for 150 minutes of moderate exercise (such as walking) or 75 minutes of vigorous exercise (such as running) each week. Where do I find calorie information?  The number of calories in a food can be found on a Nutrition Facts label. If a food does not have a Nutrition Facts label, try to look up the calories online or ask your dietitian for help. Remember that calories are listed per serving. If you choose to have more than one serving of a food, you will have to multiply the calories per serving by the amount of servings you plan to eat. For example, the label on a package of bread might say that a serving size is 1 slice and that there are 90 calories in a serving. If you eat 1 slice, you will have eaten 90 calories. If you eat 2 slices, you will have eaten 180 calories. How do I keep a food log? Immediately  after each meal, record the following information in your food log:  What you ate. Don't forget to include toppings, sauces, and other extras on the food.  How much you ate. This can be measured in cups, ounces, or number of items.  How many calories each food and drink had.  The total number of calories in the meal. Keep your food log near you, such as in a small notebook in your pocket, or use a mobile app or website. Some programs will calculate calories for you and show you how many calories you have left for the day to meet your goal. What are some calorie counting tips?   Use your calories on foods and drinks that will fill you up and not leave you hungry: ? Some examples of foods that fill you up are nuts and nut butters, vegetables, lean proteins, and high-fiber foods like whole grains. High-fiber foods are foods with more than 5 g fiber per serving. ? Drinks such as sodas, specialty coffee drinks, alcohol, and juices have a lot of calories, yet do not fill you up.  Eat nutritious foods and avoid empty calories. Empty calories are calories you get from foods or beverages that do not have many vitamins or protein, such as candy, sweets, and soda. It is better to have a nutritious high-calorie food (such as an avocado) than a food with few nutrients (such as a bag of chips).  Know how many calories are in the foods you eat most often. This will help you calculate calorie counts faster.  Pay attention to calories in drinks. Low-calorie drinks include water and unsweetened drinks.  Pay attention to nutrition labels for "low fat" or "fat free" foods. These foods sometimes have the same amount of calories or more calories than the full fat versions. They also often have added sugar, starch, or salt, to make up for flavor that was removed with the fat.  Find a way of tracking calories that works for you. Get creative. Try different apps or programs if writing down calories does not work for  you. What are some portion control tips?  Know how many calories are in a serving. This will help you know how many servings of a certain food you can have.  Use a measuring cup to measure serving sizes. You could also try weighing out portions on a kitchen scale. With time,  you will be able to estimate serving sizes for some foods.  Take some time to put servings of different foods on your favorite plates, bowls, and cups so you know what a serving looks like.  Try not to eat straight from a bag or box. Doing this can lead to overeating. Put the amount you would like to eat in a cup or on a plate to make sure you are eating the right portion.  Use smaller plates, glasses, and bowls to prevent overeating.  Try not to multitask (for example, watch TV or use your computer) while eating. If it is time to eat, sit down at a table and enjoy your food. This will help you to know when you are full. It will also help you to be aware of what you are eating and how much you are eating. What are tips for following this plan? Reading food labels  Check the calorie count compared to the serving size. The serving size may be smaller than what you are used to eating.  Check the source of the calories. Make sure the food you are eating is high in vitamins and protein and low in saturated and trans fats. Shopping  Read nutrition labels while you shop. This will help you make healthy decisions before you decide to purchase your food.  Make a grocery list and stick to it. Cooking  Try to cook your favorite foods in a healthier way. For example, try baking instead of frying.  Use low-fat dairy products. Meal planning  Use more fruits and vegetables. Half of your plate should be fruits and vegetables.  Include lean proteins like poultry and fish. How do I count calories when eating out?  Ask for smaller portion sizes.  Consider sharing an entree and sides instead of getting your own entree.  If  you get your own entree, eat only half. Ask for a box at the beginning of your meal and put the rest of your entree in it so you are not tempted to eat it.  If calories are listed on the menu, choose the lower calorie options.  Choose dishes that include vegetables, fruits, whole grains, low-fat dairy products, and lean protein.  Choose items that are boiled, broiled, grilled, or steamed. Stay away from items that are buttered, battered, fried, or served with cream sauce. Items labeled "crispy" are usually fried, unless stated otherwise.  Choose water, low-fat milk, unsweetened iced tea, or other drinks without added sugar. If you want an alcoholic beverage, choose a lower calorie option such as a glass of wine or light beer.  Ask for dressings, sauces, and syrups on the side. These are usually high in calories, so you should limit the amount you eat.  If you want a salad, choose a garden salad and ask for grilled meats. Avoid extra toppings like bacon, cheese, or fried items. Ask for the dressing on the side, or ask for olive oil and vinegar or lemon to use as dressing.  Estimate how many servings of a food you are given. For example, a serving of cooked rice is  cup or about the size of half a baseball. Knowing serving sizes will help you be aware of how much food you are eating at restaurants. The list below tells you how big or small some common portion sizes are based on everyday objects: ? 1 oz--4 stacked dice. ? 3 oz--1 deck of cards. ? 1 tsp--1 die. ? 1 Tbsp-- a ping-pong ball. ?  2 Tbsp--1 ping-pong ball. ?  cup-- baseball. ? 1 cup--1 baseball. Summary  Calorie counting means keeping track of how many calories you eat and drink each day. If you eat fewer calories than your body needs, you should lose weight.  A healthy amount of weight to lose per week is usually 1-2 lb (0.5-0.9 kg). This usually means reducing your daily calorie intake by 500-750 calories.  The number of  calories in a food can be found on a Nutrition Facts label. If a food does not have a Nutrition Facts label, try to look up the calories online or ask your dietitian for help.  Use your calories on foods and drinks that will fill you up, and not on foods and drinks that will leave you hungry.  Use smaller plates, glasses, and bowls to prevent overeating. This information is not intended to replace advice given to you by your health care provider. Make sure you discuss any questions you have with your health care provider. Document Revised: 09/01/2018 Document Reviewed: 11/12/2016 Elsevier Patient Education  2020 [employer].  Exercising to Lose Weight Exercise is structured, repetitive physical activity to improve fitness and health. Getting regular exercise is important for everyone. It is especially important if you are overweight. Being overweight increases your risk of heart disease, stroke, diabetes, high blood pressure, and several types of cancer. Reducing your calorie intake and exercising can help you lose weight. Exercise is usually categorized as moderate or vigorous intensity. To lose weight, most people need to do a certain amount of moderate-intensity or vigorous-intensity exercise each week. Moderate-intensity exercise  Moderate-intensity exercise is any activity that gets you moving enough to burn at least three times more energy (calories) than if you were sitting. Examples of moderate exercise include:  Walking a mile in 15 minutes.  Doing light yard work.  Biking at an easy pace. Most people should get at least 150 minutes (2 hours and 30 minutes) a week of moderate-intensity exercise to maintain their body weight. Vigorous-intensity exercise Vigorous-intensity exercise is any activity that gets you moving enough to burn at least six times more calories than if you were sitting. When you exercise at this intensity, you should be working hard enough that you are not able  to carry on a conversation. Examples of vigorous exercise include:  Running.  Playing a team sport, such as football, basketball, and soccer.  Jumping rope. Most people should get at least 75 minutes (1 hour and 15 minutes) a week of vigorous-intensity exercise to maintain their body weight. How can exercise affect me? When you exercise enough to burn more calories than you eat, you lose weight. Exercise also reduces body fat and builds muscle. The more muscle you have, the more calories you burn. Exercise also:  Improves mood.  Reduces stress and tension.  Improves your overall fitness, flexibility, and endurance.  Increases bone strength. The amount of exercise you need to lose weight depends on:  Your age.  The type of exercise.  Any health conditions you have.  Your overall physical ability. Talk to your health care provider about how much exercise you need and what types of activities are safe for you. What actions can I take to lose weight? Nutrition   Make changes to your diet as told by your health care provider or diet and nutrition specialist (dietitian). This may include: ? Eating fewer calories. ? Eating more protein. ? Eating less unhealthy fats. ? Eating a diet that includes fresh fruits  and vegetables, whole grains, low-fat dairy products, and lean protein. ? Avoiding foods with added fat, salt, and sugar.  Drink plenty of water while you exercise to prevent dehydration or heat stroke. Activity  Choose an activity that you enjoy and set realistic goals. Your health care provider can help you make an exercise plan that works for you.  Exercise at a moderate or vigorous intensity most days of the week. ? The intensity of exercise may vary from person to person. You can tell how intense a workout is for you by paying attention to your breathing and heartbeat. Most people will notice their breathing and heartbeat get faster with more intense exercise.  Do  resistance training twice each week, such as: ? Push-ups. ? Sit-ups. ? Lifting weights. ? Using resistance bands.  Getting short amounts of exercise can be just as helpful as long structured periods of exercise. If you have trouble finding time to exercise, try to include exercise in your daily routine. ? Get up, stretch, and walk around every 30 minutes throughout the day. ? Go for a walk during your lunch break. ? Park your car farther away from your destination. ? If you take public transportation, get off one stop early and walk the rest of the way. ? Make phone calls while standing up and walking around. ? Take the stairs instead of elevators or escalators.  Wear comfortable clothes and shoes with good support.  Do not exercise so much that you hurt yourself, feel dizzy, or get very short of breath. Where to find more information  [place] Department of Health and Human Services: [employer].at  Centers for Disease Control and Prevention (CDC): http://www.wolf.info/ Contact a health care provider:  Before starting a new exercise program.  If you have questions or concerns about your weight.  If you have a medical problem that keeps you from exercising. Get help right away if you have any of the following while exercising:  Injury.  Dizziness.  Difficulty breathing or shortness of breath that does not go away when you stop exercising.  Chest pain.  Rapid heartbeat. Summary  Being overweight increases your risk of heart disease, stroke, diabetes, high blood pressure, and several types of cancer.  Losing weight happens when you burn more calories than you eat.  Reducing the amount of calories you eat in addition to getting regular moderate or vigorous exercise each week helps you lose weight. This information is not intended to replace advice given to you by your health care provider. Make sure you discuss any questions you have with your health care provider. Document Revised:  12/26/2017 Document Reviewed: 12/26/2017 Elsevier Patient Education  2020 [employer].

## 2020-02-21 ENCOUNTER — Encounter: Payer: Self-pay | Admitting: Pulmonary Disease

## 2020-03-20 ENCOUNTER — Ambulatory Visit: Payer: 59 | Attending: Internal Medicine

## 2020-03-20 DIAGNOSIS — Z23 Encounter for immunization: Secondary | ICD-10-CM

## 2020-03-20 NOTE — Progress Notes (Signed)
   [facility]  Name:  Emily Myers    MRN: [id number] DOB: 1976-11-27  03/20/2020  Ms. Gutter was observed post Covid-19 immunization for 15 minutes without incident. She was provided with Vaccine Information Sheet and instruction to access the V-Safe system.   [name] was instructed to call 911 with any severe reactions post vaccine: Marland Kitchen Difficulty breathing  . Swelling of face and throat  . A fast heartbeat  . A bad rash all over body  . Dizziness and weakness   Immunizations Administered    Name Date Dose VIS Date Route   Pfizer COVID-19 Vaccine 03/20/2020 10:17 AM 0.3 mL 12/07/2019 Intramuscular   Manufacturer: [place]   Lot: [id number]   [place]: [id number]

## 2020-04-09 ENCOUNTER — Other Ambulatory Visit: Payer: Self-pay

## 2020-04-10 ENCOUNTER — Ambulatory Visit (INDEPENDENT_AMBULATORY_CARE_PROVIDER_SITE_OTHER): Payer: 59 | Admitting: Family Medicine

## 2020-04-10 ENCOUNTER — Encounter: Payer: Self-pay | Admitting: Family Medicine

## 2020-04-10 VITALS — BP 110/64 | HR 62 | Temp 97.5°F | Wt 222.0 lb

## 2020-04-10 DIAGNOSIS — R609 Edema, unspecified: Secondary | ICD-10-CM

## 2020-04-10 DIAGNOSIS — R011 Cardiac murmur, unspecified: Secondary | ICD-10-CM

## 2020-04-10 NOTE — Progress Notes (Signed)
Subjective:    Patient ID: Emily Myers, female    DOB: 01-01-1976, 44 y.o.   MRN: [id number]  No chief complaint on file.   HPI Patient was seen today for ongoing concern.  Patient endorses lower extremity edema at the end of the day.  Pt states edema resolved in the mornings but returns q. evening.  Pt does a lot of sitting/standing at work wearing compression socks and elevating feet when able.  Pt denies increased sodium intake.  Mostly cooks at home with little salt.  Stopped eating chips.  Recently noticed hand edema when walking in the park.  Does not have hand edema when on the elliptical with the arm levers.  Denies joint pain, erythema, SOB, rash, family h/o autoimmune dz.  Pt still taking care of her mother's estate after her recent death.  She had to go to probate court, had an Advertising account executive, and is in the process of trying to sell her mother's home.  Pt states she is unsure if she has had a moment to grieve yet.  Past Medical History:  Diagnosis Date  . Anemia   . Blood transfusion without reported diagnosis 06/2018   [facility] - 2 units transfused  . Endometrial polyp    Benign  . Heart murmur    as a child, never has caused any problems  . HSV infection   . SVD (spontaneous vaginal delivery)    x 2    Allergies  Allergen Reactions  . Aspirin Hives    ROS General: Denies fever, chills, night sweats, changes in weight, changes in appetite HEENT: Denies headaches, ear pain, changes in vision, rhinorrhea, sore throat CV: Denies CP, palpitations, SOB, orthopnea Pulm: Denies SOB, cough, wheezing GI: Denies abdominal pain, nausea, vomiting, diarrhea, constipation GU: Denies dysuria, hematuria, frequency, vaginal discharge Msk: Denies muscle cramps, joint pains  +hand and LE edema Neuro: Denies weakness, numbness, tingling Skin: Denies rashes, bruising Psych: Denies depression, anxiety, hallucinations      Objective:    Blood pressure 110/64, pulse 62,  temperature (!) 97.5 F (36.4 C), temperature source Temporal, weight 222 lb (100.7 kg), SpO2 98 %, unknown if currently breastfeeding.  Gen. Pleasant, well-nourished, in no distress, normal affect   HEENT: /AT, face symmetric, no scleral icterus, PERRLA, EOMI, nares patent without drainage. Lungs: no accessory muscle use, CTAB, no wheezes or rales Cardiovascular: RRR, murmur 2/6, b/l LEs with 1+ edema at shins. Musculoskeletal: No deformities, no cyanosis or clubbing, normal tone Neuro:  A&Ox3, CN II-XII intact, normal gait Skin:  Warm, no lesions/ rash.  B/l hands appear mildly edematous/puff, but not tight. Able to make a fist.   Wt Readings from Last 3 Encounters:  02/06/20 219 lb (99.3 kg)  01/02/20 218 lb (98.9 kg)  06/27/19 212 lb (96.2 kg)    Lab Results  Component Value Date   WBC 6.2 06/27/2019   HGB 11.7 (L) 06/27/2019   HCT 36.8 06/27/2019   PLT 355.0 06/27/2019   GLUCOSE 75 06/27/2019   CHOL 196 06/27/2019   TRIG 73.0 06/27/2019   HDL 58.90 06/27/2019   LDLCALC 122 (H) 06/27/2019   ALT 12 07/04/2018   AST 14 (L) 08/11/2018   NA 137 06/27/2019   K 4.2 06/27/2019   CL 103 06/27/2019   CREATININE 0.75 06/27/2019   BUN 15 06/27/2019   CO2 25 06/27/2019   TSH 1.65 06/27/2019   HGBA1C 5.2 06/27/2019    Assessment/Plan:  Heart murmur -stable -last ECHO 07/10/2018 without  evidence of valvular disease. -for increase in murmur or worsened LE edema consider repeat ECHO -will continue to monitor.  Peripheral edema  -Discussed various causes including dependent edema, increased sodium intake, medication-estrogen in OCPs, autoimmune d/o. -continue supportive care including decreasing sodium intake, elevating lower extremities, compression socks/TED hose -Discussed low-dose of diuretic however concern may not tolerate 2/2 pt's low normal BP.   -will try lasix 10 mg for 2 days.   -We will obtain labs. - Plan: Brain Natriuretic Peptide, Basic metabolic panel, ANA,  Sedimentation Rate, Urinalysis with Reflex Microscopic, Protein / Creatinine Ratio, Urine, Anti-DNA antibody, double-stranded  F/u in 1 month   Grier Mitts, MD

## 2020-04-10 NOTE — Patient Instructions (Signed)
Peripheral Edema  Peripheral edema is swelling that is caused by a buildup of fluid. Peripheral edema most often affects the lower legs, ankles, and feet. It can also develop in the arms, hands, and face. The area of the body that has peripheral edema will look swollen. It may also feel heavy or warm. Your clothes may start to feel tight. Pressing on the area may make a temporary dent in your skin. You may not be able to move your swollen arm or leg as much as usual. There are many causes of peripheral edema. It can happen because of a complication of other conditions such as congestive heart failure, kidney disease, or a problem with your blood circulation. It also can be a side effect of certain medicines or because of an infection. It often happens to women during pregnancy. Sometimes, the cause is not known. Follow these instructions at home: Managing pain, stiffness, and swelling   Raise (elevate) your legs while you are sitting or lying down.  Move around often to prevent stiffness and to lessen swelling.  Do not sit or stand for long periods of time.  Wear support stockings as told by your health care provider. Medicines  Take over-the-counter and prescription medicines only as told by your health care provider.  Your health care provider may prescribe medicine to help your body get rid of excess water (diuretic). General instructions  Pay attention to any changes in your symptoms.  Follow instructions from your health care provider about limiting salt (sodium) in your diet. Sometimes, eating less salt may reduce swelling.  Moisturize skin daily to help prevent skin from cracking and draining.  Keep all follow-up visits as told by your health care provider. This is important. Contact a health care provider if you have:  A fever.  Edema that starts suddenly or is getting worse, especially if you are pregnant or have a medical condition.  Swelling in only one leg.  Increased  swelling, redness, or pain in one or both of your legs.  Drainage or sores at the area where you have edema. Get help right away if you:  Develop shortness of breath, especially when you are lying down.  Have pain in your chest or abdomen.  Feel weak.  Feel faint. Summary  Peripheral edema is swelling that is caused by a buildup of fluid. Peripheral edema most often affects the lower legs, ankles, and feet.  Move around often to prevent stiffness and to lessen swelling. Do not sit or stand for long periods of time.  Pay attention to any changes in your symptoms.  Contact a health care provider if you have edema that starts suddenly or is getting worse, especially if you are pregnant or have a medical condition.  Get help right away if you develop shortness of breath, especially when lying down. This information is not intended to replace advice given to you by your health care provider. Make sure you discuss any questions you have with your health care provider. Document Revised: 09/06/2018 Document Reviewed: 09/06/2018 Elsevier Patient Education  2020 Elsevier Inc.  

## 2020-04-11 LAB — BASIC METABOLIC PANEL
BUN: 16 mg/dL (ref 6–23)
CO2: 27 mEq/L (ref 19–32)
Calcium: 8.8 mg/dL (ref 8.4–10.5)
Chloride: 106 mEq/L (ref 96–112)
Creatinine, Ser: 0.71 mg/dL (ref 0.40–1.20)
GFR: 108.47 mL/min (ref >60.00)
Glucose, Bld: 93 mg/dL (ref 70–99)
Potassium: 4.5 mEq/L (ref 3.5–5.1)
Sodium: 139 mEq/L (ref 135–145)

## 2020-04-11 LAB — URINALYSIS, ROUTINE W REFLEX MICROSCOPIC
Bilirubin Urine: NEGATIVE
Ketones, ur: NEGATIVE
Leukocytes,Ua: NEGATIVE
Nitrite: NEGATIVE
Specific Gravity, Urine: >=1.03 — AB (ref 1.000–1.030)
Total Protein, Urine: NEGATIVE
Urine Glucose: NEGATIVE
Urobilinogen, UA: 0.2 (ref 0.0–1.0)
pH: 6 (ref 5.0–8.0)

## 2020-04-11 LAB — SEDIMENTATION RATE: Sed Rate: 9 mm/hr (ref 0–20)

## 2020-04-11 MED ORDER — FUROSEMIDE 20 MG PO TABS: 10.0000 mg | ORAL_TABLET | Freq: Every day | ORAL | 0 refills | Status: DC

## 2020-04-11 NOTE — Addendum Note (Signed)
Addended by: Suzette Battiest on: 04/11/2020 03:46 PM   Modules accepted: Orders

## 2020-04-12 LAB — BRAIN NATRIURETIC PEPTIDE: Brain Natriuretic Peptide: 7 pg/mL (ref <100)

## 2020-04-12 LAB — ANA: Anti Nuclear Antibody (ANA): NEGATIVE

## 2020-04-12 LAB — PROTEIN / CREATININE RATIO, URINE
Creatinine, Urine: 189 mg/dL (ref 20–275)
Protein/Creat Ratio: 53 mg/g creat (ref 21–161)
Protein/Creatinine Ratio: 0.053 mg/mg creat (ref 0.021–0.16)
Total Protein, Urine: 10 mg/dL (ref 5–24)

## 2020-04-12 LAB — ANTI-DNA ANTIBODY, DOUBLE-STRANDED: ds DNA Ab: <1 IU/mL

## 2020-04-14 ENCOUNTER — Ambulatory Visit: Payer: 59 | Attending: Internal Medicine

## 2020-04-14 DIAGNOSIS — Z23 Encounter for immunization: Secondary | ICD-10-CM

## 2020-04-14 NOTE — Progress Notes (Signed)
   Covid-19 Vaccination Clinic  Name:  Misti Hoye    MRN: [id number] DOB: [birth year]  04/14/2020  Ms. Evarts was observed post Covid-19 immunization for 15 minutes without incident. She was provided with Vaccine Information Sheet and instruction to access the V-Safe system.   Ms. Youngdahl was instructed to call 911 with any severe reactions post vaccine: Marland Kitchen Difficulty breathing  . Swelling of face and throat  . A fast heartbeat  . A bad rash all over body  . Dizziness and weakness   Immunizations Administered    Name Date Dose VIS Date Route   Pfizer COVID-19 Vaccine 04/14/2020  4:31 PM 0.3 mL 02/20/2019 Intramuscular   Manufacturer: [place]   Lot: [id number]   [place]: [id number]

## 2020-04-16 ENCOUNTER — Other Ambulatory Visit: Payer: Self-pay | Admitting: Family Medicine

## 2020-12-27 HISTORY — PX: KNEE ARTHROSCOPY W/ DEBRIDEMENT: SHX1867

## 2021-01-22 NOTE — Progress Notes (Signed)
Cardiology Office Note   Date:  01/26/2021   ID:  Emily Myers, DOB June 05, 1976, MRN [id number]  PCP:  Billie Ruddy, MD  Cardiologist:   Jenkins Rouge, MD   No chief complaint on file.     History of Present Illness: Emily Myers is a 45 y.o. female first seen May 2019  She mentions ? VSD. Still taking antibiotics before dental work She has anemia with previous need for transfusion. Her f/u echo was normal   Pt is married.  Pt has 2 girls, ages 83 and 48.  Pt works as a Education officer, museum at [employer].  Pt is currently in school at [employer].  Pt denies tobacco use or drug use.    Previously wedded to idea of SBE prophylaxis   Her husband was diagnosed with DM 76 yo daughter has masters still living in [place] 45 yo "driving me crazy" at [place] had some LE dependant edema. Mom passed in February  2021 Primary suggested PRN lasix BNP normal 04/11/20 normal  Urine protein/cr ratio Anti DNA ESR and ANA, K, Cr  Had COVID over December and has been vaccinated   Past Medical History:  Diagnosis Date  . Anemia   . Blood transfusion without reported diagnosis 06/2018   [facility] - 2 units transfused  . Endometrial polyp    Benign  . Heart murmur    as a child, never has caused any problems  . HSV infection   . SVD (spontaneous vaginal delivery)    x 2    Past Surgical History:  Procedure Laterality Date  . D&C x 2     Polyp removal 2011 by Dr. Cheri Rous in [place]  . DILATION AND CURETTAGE OF UTERUS    . Hysterosalpingography    . iron infusions    . MYOMECTOMY N/A 12/14/2018   Procedure: Exploratory Laparotomy MYOMECTOMY;  Surgeon: Servando Salina, MD;  Location: [facility];  Service: Gynecology;  Laterality: N/A;  2hrs.  . WISDOM TOOTH EXTRACTION       Current Outpatient Medications  Medication Sig Dispense Refill  . Turmeric (QC TUMERIC COMPLEX PO) Take by mouth daily. With ginger powder    . ferrous sulfate 325 (65 FE)  MG tablet Take 1 tablet (325 mg total) by mouth daily with breakfast. Increase to twice daily as tolerated (Patient taking differently: Take 650 mg by mouth 2 (two) times daily with a meal.) 60 tablet 0   No current facility-administered medications for this visit.    Allergies:   Aspirin    Social History:  The patient  reports that she has never smoked. She has never used smokeless tobacco. She reports current alcohol use. She reports that she does not use drugs.   Family History:  The patient's family history includes Eczema in her mother.    ROS:  Please see the history of present illness.   Otherwise, review of systems are positive for none.   All other systems are reviewed and negative.    PHYSICAL EXAM: VS:  BP 124/76   Pulse 66   Ht '5\' 2"'  (1.575 m)   Wt 101.6 kg   BMI 40.97 kg/m  , BMI Body mass index is 40.97 kg/m.  No distress No JVP elevation  No tachypnea     EKG:  12/04/13 SB rate 58 normal  05/04/18  SR rate 57 normal    Recent Labs: 04/10/2020: BUN 16; Creatinine, Ser 0.71; Potassium 4.5; Sodium  139 04/11/2020: Brain Natriuretic Peptide 7    Lipid Panel    Component Value Date/Time   CHOL 196 06/27/2019 1518   TRIG 73.0 06/27/2019 1518   HDL 58.90 06/27/2019 1518   CHOLHDL 3 06/27/2019 1518   VLDL 14.6 06/27/2019 1518   LDLCALC 122 (H) 06/27/2019 1518      Wt Readings from Last 3 Encounters:  01/26/21 101.6 kg  04/10/20 100.7 kg  02/06/20 99.3 kg      Other studies Reviewed: Additional studies/ records that were reviewed today include: TTE 07/10/18    ASSESSMENT AND PLAN:  1.  Murmur:  No exam today TTE 07/10/18 EF 55-60% no valve disease 2. Anemia stable last 5 years related to heavy menses which has improved over time   does not like to take iron  3. Edema:  Dependant labs ok will update echo low sodium diet PRN diuretic ok elevation and compression stockings Discussed   Current medicines are reviewed at length with the patient today.   The patient does not have concerns regarding medicines.  The following changes have been made:  no change  Labs/ tests ordered today include: TTE  No orders of the defined types were placed in this encounter.    Disposition:   FU with cardiology in a year      Signed, Jenkins Rouge, MD  01/26/2021 9:29 AM    [address], [place], [place]  [address] Phone: [phone]; Fax: [phone]

## 2021-01-26 ENCOUNTER — Telehealth (INDEPENDENT_AMBULATORY_CARE_PROVIDER_SITE_OTHER): Payer: 59 | Admitting: Cardiovascular Disease

## 2021-01-26 ENCOUNTER — Other Ambulatory Visit: Payer: Self-pay

## 2021-01-26 VITALS — BP 124/76 | HR 66 | Ht 62.0 in | Wt 224.0 lb

## 2021-01-26 DIAGNOSIS — R011 Cardiac murmur, unspecified: Secondary | ICD-10-CM | POA: Diagnosis not present

## 2021-01-26 DIAGNOSIS — R609 Edema, unspecified: Secondary | ICD-10-CM

## 2021-01-26 NOTE — Patient Instructions (Addendum)
Medication Instructions:  *If you need a refill on your cardiac medications before your next appointment, please call your pharmacy*  Lab Work: If you have labs (blood work) drawn today and your tests are completely normal, you will receive your results only by: Marland Kitchen MyChart Message (if you have MyChart) OR . A paper copy in the mail If you have any lab test that is abnormal or we need to change your treatment, we will call you to review the results.  Testing/Procedures: Your physician has requested that you have an echocardiogram. Echocardiography is a painless test that uses sound waves to create images of your heart. It provides your doctor with information about the size and shape of your heart and how well your heart's chambers and valves are working. This procedure takes approximately one hour. There are no restrictions for this procedure.  Cardiac CT scanning for calcium score, (CAT scanning), is a noninvasive, special x-ray that produces cross-sectional images of the body using x-rays and a computer. CT scans help physicians diagnose and treat medical conditions. For some CT exams, a contrast material is used to enhance visibility in the area of the body being studied. CT scans provide greater clarity and reveal more details than regular x-ray exams.  Follow-Up: At [facility], you and your health needs are our priority.  As part of our continuing mission to provide you with exceptional heart care, we have created designated Provider Care Teams.  These Care Teams include your primary Cardiologist (physician) and Advanced Practice Providers (APPs -  Physician Assistants and Nurse Practitioners) who all work together to provide you with the care you need, when you need it.  We recommend signing up for the patient portal called "MyChart".  Sign up information is provided on this After Visit Summary.  MyChart is used to connect with patients for Virtual Visits (Telemedicine).  Patients are able  to view lab/test results, encounter notes, upcoming appointments, etc.  Non-urgent messages can be sent to your provider as well.   To learn more about what you can do with MyChart, go to [email or web address].    Your next appointment:   6 month(s)  The format for your next appointment:   In Person  Provider:   You may see Dr. Johnsie Cancel or one of the following Advanced Practice Providers on your designated Care Team:    Kathyrn Drown, NP

## 2021-02-19 ENCOUNTER — Other Ambulatory Visit: Payer: Self-pay

## 2021-02-19 ENCOUNTER — Ambulatory Visit (INDEPENDENT_AMBULATORY_CARE_PROVIDER_SITE_OTHER)
Admission: RE | Admit: 2021-02-19 | Discharge: 2021-02-19 | Disposition: A | Discharge status: Home / Self Care | Payer: Self-pay | Source: Ambulatory Visit | Attending: Cardiovascular Disease | Admitting: Cardiovascular Disease

## 2021-02-19 ENCOUNTER — Ambulatory Visit (HOSPITAL_COMMUNITY): Payer: 59 | Attending: Cardiology

## 2021-02-19 DIAGNOSIS — R609 Edema, unspecified: Secondary | ICD-10-CM | POA: Diagnosis not present

## 2021-02-19 DIAGNOSIS — R011 Cardiac murmur, unspecified: Secondary | ICD-10-CM

## 2021-02-19 LAB — ECHOCARDIOGRAM COMPLETE
Area-P 1/2: 3.99 cm2
S' Lateral: 3.3 cm

## 2021-04-08 ENCOUNTER — Other Ambulatory Visit: Payer: Self-pay | Admitting: Family Medicine

## 2021-04-08 ENCOUNTER — Other Ambulatory Visit: Payer: Self-pay

## 2021-04-08 ENCOUNTER — Encounter: Payer: Self-pay | Admitting: Family Medicine

## 2021-04-08 ENCOUNTER — Ambulatory Visit (INDEPENDENT_AMBULATORY_CARE_PROVIDER_SITE_OTHER): Payer: 59 | Admitting: Family Medicine

## 2021-04-08 ENCOUNTER — Ambulatory Visit (INDEPENDENT_AMBULATORY_CARE_PROVIDER_SITE_OTHER): Payer: 59

## 2021-04-08 VITALS — BP 122/80 | HR 64 | Temp 98.7°F | Wt 217.8 lb

## 2021-04-08 DIAGNOSIS — M25562 Pain in left knee: Secondary | ICD-10-CM

## 2021-04-08 DIAGNOSIS — M25469 Effusion, unspecified knee: Secondary | ICD-10-CM

## 2021-04-08 DIAGNOSIS — Z1159 Encounter for screening for other viral diseases: Secondary | ICD-10-CM

## 2021-04-08 DIAGNOSIS — E559 Vitamin D deficiency, unspecified: Secondary | ICD-10-CM

## 2021-04-08 DIAGNOSIS — Z13220 Encounter for screening for lipoid disorders: Secondary | ICD-10-CM

## 2021-04-08 DIAGNOSIS — M25561 Pain in right knee: Secondary | ICD-10-CM | POA: Diagnosis not present

## 2021-04-08 DIAGNOSIS — G8929 Other chronic pain: Secondary | ICD-10-CM

## 2021-04-08 DIAGNOSIS — Z0000 Encounter for general adult medical examination without abnormal findings: Secondary | ICD-10-CM

## 2021-04-08 LAB — HEMOGLOBIN A1C: Hgb A1c MFr Bld: 5.4 % (ref 4.6–6.5)

## 2021-04-08 LAB — CBC WITH DIFFERENTIAL/PLATELET
Basophils Absolute: 0.1 10*3/uL (ref 0.0–0.1)
Basophils Relative: 1.5 % (ref 0.0–3.0)
Eosinophils Absolute: 0.3 10*3/uL (ref 0.0–0.7)
Eosinophils Relative: 5.7 % — ABNORMAL HIGH (ref 0.0–5.0)
HCT: 34.5 % — ABNORMAL LOW (ref 36.0–46.0)
Hemoglobin: 10.8 g/dL — ABNORMAL LOW (ref 12.0–15.0)
Lymphocytes Relative: 30.7 % (ref 12.0–46.0)
Lymphs Abs: 1.8 10*3/uL (ref 0.7–4.0)
MCHC: 31.4 g/dL (ref 30.0–36.0)
MCV: 79.4 fl (ref 78.0–100.0)
Monocytes Absolute: 0.6 10*3/uL (ref 0.1–1.0)
Monocytes Relative: 11.1 % (ref 3.0–12.0)
Neutro Abs: 3 10*3/uL (ref 1.4–7.7)
Neutrophils Relative %: 51 % (ref 43.0–77.0)
Platelets: 364 10*3/uL (ref 150.0–400.0)
RBC: 4.34 Mil/uL (ref 3.87–5.11)
RDW: 16.3 % — ABNORMAL HIGH (ref 11.5–15.5)
WBC: 5.8 10*3/uL (ref 4.0–10.5)

## 2021-04-08 LAB — BASIC METABOLIC PANEL
BUN: 14 mg/dL (ref 6–23)
CO2: 27 mEq/L (ref 19–32)
Calcium: 9.1 mg/dL (ref 8.4–10.5)
Chloride: 105 mEq/L (ref 96–112)
Creatinine, Ser: 0.74 mg/dL (ref 0.40–1.20)
GFR: 98.37 mL/min (ref >60.00)
Glucose, Bld: 76 mg/dL (ref 70–99)
Potassium: 4.6 mEq/L (ref 3.5–5.1)
Sodium: 138 mEq/L (ref 135–145)

## 2021-04-08 LAB — T4, FREE: Free T4: 0.77 ng/dL (ref 0.60–1.60)

## 2021-04-08 LAB — LIPID PANEL
Cholesterol: 169 mg/dL (ref 0–200)
HDL: 54.7 mg/dL (ref >39.00)
LDL Cholesterol: 100 mg/dL — ABNORMAL HIGH (ref 0–99)
NonHDL: 114.06
Total CHOL/HDL Ratio: 3
Triglycerides: 72 mg/dL (ref 0.0–149.0)
VLDL: 14.4 mg/dL (ref 0.0–40.0)

## 2021-04-08 LAB — VITAMIN D 25 HYDROXY (VIT D DEFICIENCY, FRACTURES): VITD: 22.05 ng/mL — ABNORMAL LOW (ref 30.00–100.00)

## 2021-04-08 LAB — TSH: TSH: 1.6 u[IU]/mL (ref 0.35–4.50)

## 2021-04-08 MED ORDER — VITAMIN D (ERGOCALCIFEROL) 1.25 MG (50000 UNIT) PO CAPS
50000.0000 [IU] | ORAL_CAPSULE | ORAL | 0 refills | Status: DC
Start: 2021-04-08 — End: 2021-11-18

## 2021-04-08 NOTE — Progress Notes (Signed)
error 

## 2021-04-08 NOTE — Patient Instructions (Addendum)
Voltaren gel can be found over the counter at your local drug store.  Preventive Care 67-45 Years Old, Female Preventive care refers to lifestyle choices and visits with your health care provider that can promote health and wellness. This includes:  A yearly physical exam. This is also called an annual wellness visit.  Regular dental and eye exams.  Immunizations.  Screening for certain conditions.  Healthy lifestyle choices, such as: ? Eating a healthy diet. ? Getting regular exercise. ? Not using drugs or products that contain nicotine and tobacco. ? Limiting alcohol use. What can I expect for my preventive care visit? Physical exam Your health care provider will check your:  Height and weight. These may be used to calculate your BMI (body mass index). BMI is a measurement that tells if you are at a healthy weight.  Heart rate and blood pressure.  Body temperature.  Skin for abnormal spots. Counseling Your health care provider may ask you questions about your:  Past medical problems.  Family's medical history.  Alcohol, tobacco, and drug use.  Emotional well-being.  Home life and relationship well-being.  Sexual activity.  Diet, exercise, and sleep habits.  Work and work Statistician.  Access to firearms.  Method of birth control.  Menstrual cycle.  Pregnancy history. What immunizations do I need? Vaccines are usually given at various ages, according to a schedule. Your health care provider will recommend vaccines for you based on your age, medical history, and lifestyle or other factors, such as travel or where you work.   What tests do I need? Blood tests  Lipid and cholesterol levels. These may be checked every 5 years, or more often if you are over 75 years old.  Hepatitis C test.  Hepatitis B test. Screening  Lung cancer screening. You may have this screening every year starting at age 7 if you have a 30-pack-year history of smoking and  currently smoke or have quit within the past 15 years.  Colorectal cancer screening. ? All adults should have this screening starting at age 52 and continuing until age 31. ? Your health care provider may recommend screening at age 35 if you are at increased risk. ? You will have tests every 1-10 years, depending on your results and the type of screening test.  Diabetes screening. ? This is done by checking your blood sugar (glucose) after you have not eaten for a while (fasting). ? You may have this done every 1-3 years.  Mammogram. ? This may be done every 1-2 years. ? Talk with your health care provider about when you should start having regular mammograms. This may depend on whether you have a family history of breast cancer.  BRCA-related cancer screening. This may be done if you have a family history of breast, ovarian, tubal, or peritoneal cancers.  Pelvic exam and Pap test. ? This may be done every 3 years starting at age 57. ? Starting at age 92, this may be done every 5 years if you have a Pap test in combination with an HPV test. Other tests  STD (sexually transmitted disease) testing, if you are at risk.  Bone density scan. This is done to screen for osteoporosis. You may have this scan if you are at high risk for osteoporosis. Talk with your health care provider about your test results, treatment options, and if necessary, the need for more tests. Follow these instructions at home: Eating and drinking  Eat a diet that includes fresh fruits and vegetables,  whole grains, lean protein, and low-fat dairy products.  Take vitamin and mineral supplements as recommended by your health care provider.  Do not drink alcohol if: ? Your health care provider tells you not to drink. ? You are pregnant, may be pregnant, or are planning to become pregnant.  If you drink alcohol: ? Limit how much you have to 0-1 drink a day. ? Be aware of how much alcohol is in your drink. In the  [place] one drink equals one 12 oz bottle of beer (355 mL), one 5 oz glass of wine (148 mL), or one 1 oz glass of hard liquor (44 mL).   Lifestyle  Take daily care of your teeth and gums. Brush your teeth every morning and night with fluoride toothpaste. Floss one time each day.  Stay active. Exercise for at least 30 minutes 5 or more days each week.  Do not use any products that contain nicotine or tobacco, such as cigarettes, e-cigarettes, and chewing tobacco. If you need help quitting, ask your health care provider.  Do not use drugs.  If you are sexually active, practice safe sex. Use a condom or other form of protection to prevent STIs (sexually transmitted infections).  If you do not wish to become pregnant, use a form of birth control. If you plan to become pregnant, see your health care provider for a prepregnancy visit.  If told by your health care provider, take low-dose aspirin daily starting at age 42.  Find healthy ways to cope with stress, such as: ? Meditation, yoga, or listening to music. ? Journaling. ? Talking to a trusted person. ? Spending time with friends and family. Safety  Always wear your seat belt while driving or riding in a vehicle.  Do not drive: ? If you have been drinking alcohol. Do not ride with someone who has been drinking. ? When you are tired or distracted. ? While texting.  Wear a helmet and other protective equipment during sports activities.  If you have firearms in your house, make sure you follow all gun safety procedures. What's next?  Visit your health care provider once a year for an annual wellness visit.  Ask your health care provider how often you should have your eyes and teeth checked.  Stay up to date on all vaccines. This information is not intended to replace advice given to you by your health care provider. Make sure you discuss any questions you have with your health care provider. Document Revised: 09/16/2020 Document  Reviewed: 08/24/2018 Elsevier Patient Education  2021 [employer].  Chronic Knee Pain, Adult Chronic knee pain is pain in one or both knees that lasts longer than 3 months. Symptoms of chronic knee pain may include swelling, stiffness, and discomfort. Age-related wear and tear (osteoarthritis) of the knee joint is the most common cause of chronic knee pain. Other possible causes include:  A long-term immune-related disease that causes inflammation of the knee (rheumatoid arthritis). This usually affects both knees.  Inflammatory arthritis, such as gout or pseudogout.  An injury to the knee that causes arthritis.  An injury to the knee that damages the ligaments. Ligaments are strong tissues that connect bones to each other.  Runner's knee or pain behind the kneecap. Treatment for chronic knee pain depends on the cause. The main treatments for chronic knee pain are physical therapy and weight loss. This condition may also be treated with medicines, injections, a knee sleeve or brace, and by using crutches. Rest, ice, pressure (  compression), and elevation, also known as RICE therapy, may also be recommended. Follow these instructions at home: If you have a knee sleeve or brace:  Wear the knee sleeve or brace as told by your health care provider. Remove it only as told by your health care provider.  Loosen it if your toes tingle, become numb, or turn cold and blue.  Keep it clean.  If the sleeve or brace is not waterproof: ? Do not let it get wet. ? Remove it if allowed by your health care provider, or cover it with a watertight covering when you take a bath or a shower.   Managing pain, stiffness, and swelling  If directed, apply heat to the affected area as often as told by your health care provider. Use the heat source that your health care provider recommends, such as a moist heat pack or a heating pad. ? If you have a removable knee sleeve or brace, remove it as told by your health  care provider. ? Place a towel between your skin and the heat source. ? Leave the heat on for 20-30 minutes. ? Remove the heat if your skin turns bright red. This is especially important if you are unable to feel pain, heat, or cold. You may have a greater risk of getting burned.  If directed, put ice on the affected area. To do this: ? If you have a removable knee sleeve or brace, remove it as told by your health care provider. ? Put ice in a plastic bag. ? Place a towel between your skin and the bag. ? Leave the ice on for 20 minutes, 2-3 times a day. ? Remove the ice if your skin turns bright red. This is very important. If you cannot feel pain, heat, or cold, you have a greater risk of damage to the area.  Move your toes often to reduce stiffness and swelling.  Raise (elevate) the injured area above the level of your heart while you are sitting or lying down.      Activity  Avoid high-impact activities or exercises, such as running, jumping rope, or doing jumping jacks.  Follow the exercise plan that your health care provider designed for you. Your health care provider may suggest that you: ? Avoid activities that make knee pain worse. This may require you to change your exercise routines, sport participation, or job duties. ? Wear shoes with cushioned soles. ? Avoid sports that require running and sudden changes in direction. ? Do physical therapy. Physical therapy is planned to match your needs and abilities. It may include exercises for strength, flexibility, stability, and endurance. ? Do exercises that increase balance and strength, such as tai chi and yoga.  Do not use the injured limb to support your body weight until your health care provider says that you can. Use crutches as told by your health care provider.  Return to your normal activities as told by your health care provider. Ask your health care provider what activities are safe for you. General instructions  Take  over-the-counter and prescription medicines only as told by your health care provider.  Lose weight if you are overweight. Losing even a little weight can reduce knee pain. Ask your health care provider what your ideal weight is, and how to safely lose extra weight. A dietitian may be able to help you plan your meals.  Do not use any products that contain nicotine or tobacco, such as cigarettes, e-cigarettes, and chewing tobacco. These can  delay healing. If you need help quitting, ask your health care provider.  Keep all follow-up visits. This is important. Contact a health care provider if:  You have knee pain that is not getting better or gets worse.  You are unable to do your physical therapy exercises due to knee pain. Get help right away if:  Your knee swells and the swelling becomes worse.  You cannot move your knee.  You have severe knee pain. Summary  Knee pain that lasts more than 3 months is considered chronic knee pain.  The main treatments for chronic knee pain are physical therapy and weight loss. You may also need to take medicines, wear a knee sleeve or brace, use crutches, and apply ice or heat.  Losing even a little weight can reduce knee pain. Ask your health care provider what your ideal weight is, and how to safely lose extra weight. A dietitian may be able to help you plan your meals.  Follow the exercise plan that your health care provider designed for you. This information is not intended to replace advice given to you by your health care provider. Make sure you discuss any questions you have with your health care provider. Document Revised: 05/28/2020 Document Reviewed: 05/28/2020 Elsevier Patient Education  2021 [employer].

## 2021-04-08 NOTE — Progress Notes (Signed)
Results viewed on MyChart. 

## 2021-04-08 NOTE — Progress Notes (Signed)
Subjective:     Emily Myers is a 45 y.o. female and is here for a comprehensive physical exam. The patient reports R knee pain and edema increasing over the last 3 months.  In the past saw ortho and had a knee injection.  Pt denies knee giving out.  States takes her a sec to go from sitting to standing, notes decreased ROM and difficulty bending it.  Endores fullness in back of knee. Taking tylenol and aleeve prn.  Since last OFV, pt went through 1 round of IVF without success.  States was able to retreive 6 eggs and make 5 embryos but they did not form blastocytes in order to freeze.    Social History   Socioeconomic History  . Marital status: Married    Spouse name: Not on file  . Number of children: 2  . Years of education: Not on file  . Highest education level: Not on file  Occupational History  . Not on file  Tobacco Use  . Smoking status: Never Smoker  . Smokeless tobacco: Never Used  Vaping Use  . Vaping Use: Never used  Substance and Sexual Activity  . Alcohol use: Yes    Comment: social wine  . Drug use: No  . Sexual activity: Yes    Birth control/protection: Pill  Other Topics Concern  . Not on file  Social History Narrative   45 yo AAF who lives at home with her stepmom and her 2 daughters ages 19 and 70. She is a never smoker, social alcohol drinker described as 1-2 drinks a month, denies illicit drug use. She has some college education and works as a Loss adjuster, chartered at [employer]. She recently moved from [place] to [place] 4 months ago and has no PCP or OB/GYN established.   Social Determinants of Health   Financial Resource Strain: Not on file  Food Insecurity: Not on file  Transportation Needs: Not on file  Physical Activity: Not on file  Stress: Not on file  Social Connections: Not on file  Intimate Partner Violence: Not on file   Health Maintenance  Topic Date Due  . Hepatitis C Screening  Never done  . TETANUS/TDAP  Never  done  . PAP SMEAR-Modifier  Never done  . COVID-19 Vaccine (3 - Booster for Pfizer series) 10/14/2020  . INFLUENZA VACCINE  07/27/2021  . HIV Screening  Completed  . HPV VACCINES  Aged Out    The following portions of the patient's history were reviewed and updated as appropriate: allergies, current medications, past family history, past medical history, past social history, past surgical history and problem list.  Review of Systems Pertinent items noted in HPI and remainder of comprehensive ROS otherwise negative.   Objective:    BP 122/80 (BP Location: Right Arm, Patient Position: Sitting, Cuff Size: Normal)   Pulse 64   Temp 98.7 F (37.1 C) (Oral)   Wt 217 lb 12.8 oz (98.8 kg)   SpO2 99%   BMI 39.84 kg/m  General appearance: alert, cooperative and no distress Head: Normocephalic, without obvious abnormality, atraumatic Eyes: conjunctivae/corneas clear. PERRL, EOM's intact. Fundi benign. Ears: normal TM's and external ear canals both ears Nose: Nares normal. Septum midline. Mucosa normal. No drainage or sinus tenderness. Throat: lips, mucosa, and tongue normal; teeth and gums normal Neck: no adenopathy, no carotid bruit, no JVD, supple, symmetrical, trachea midline and thyroid not enlarged, symmetric, no tenderness/mass/nodules Lungs: clear to auscultation bilaterally Heart: regular rate and rhythm,  2/6 murmur best heard at LUSB, no click, rub or gallop Abdomen: soft, non-tender; bowel sounds normal; no masses,  no organomegaly Extremities: moves all four extremities.  R knee with limited ROM, mild edema and fullness of R popliteal fossa, no effusion noted.  TTP of R knee at medial joint line/pes anserine bursa. negative ant drawer and lachman's.  L knee with crepitus, no joint line TTP. Pulses: 2+ and symmetric Skin: Skin color, texture, turgor normal. No rashes or lesions Lymph nodes: Cervical, supraclavicular, and axillary nodes normal. Neurologic: Alert and oriented X 3,  normal strength and tone. Normal symmetric reflexes. Normal coordination and gait    Assessment:    Healthy female exam with medial R knee pain and crepitus of L knee.     Plan:     Anticipatory guidance given including wearing seatbelts, smoke detectors in the home, increasing physical activity, increasing p.o. intake of water and vegetables. -will obtain labs -plan for colonoscopy in October 2022 when pt turns 45. -schedule mammogram -pap done 11/21 with OB/Gyn -given handout -next CPE in 1 yr  Screening for cholesterol level  - Plan: Lipid panel  Edema of knee  -supportive care -discussed possible causes including arthritis, bursitis - Plan: DG Knee Complete 4 Views Right  Chronic pain of both knees  -supportive care including heat, ice, rest, compression, elevation. -discussed using OTC Voltaren gel - Plan: DG Knee Complete 4 Views Right  Encounter for hepatitis C screening test for low risk patient  - Plan: Hep C Antibody  F/u prn in the next few wks  Grier Mitts, MD See After Visit Summary for Counseling Recommendations

## 2021-04-16 ENCOUNTER — Other Ambulatory Visit: Payer: Self-pay | Admitting: Family Medicine

## 2021-04-16 DIAGNOSIS — M25561 Pain in right knee: Secondary | ICD-10-CM

## 2021-04-16 NOTE — Progress Notes (Signed)
ATC no answer, per FYI left detailed vm.

## 2021-04-22 ENCOUNTER — Encounter: Payer: Self-pay | Admitting: Orthopaedic Surgery

## 2021-04-22 ENCOUNTER — Other Ambulatory Visit: Payer: Self-pay

## 2021-04-22 ENCOUNTER — Ambulatory Visit: Payer: 59 | Admitting: Orthopaedic Surgery

## 2021-04-22 DIAGNOSIS — M25561 Pain in right knee: Secondary | ICD-10-CM | POA: Diagnosis not present

## 2021-04-22 DIAGNOSIS — G8929 Other chronic pain: Secondary | ICD-10-CM | POA: Diagnosis not present

## 2021-04-22 NOTE — Progress Notes (Signed)
Office Visit Note   Patient: Emily Myers           Date of Birth: [birth year]           MRN: [id number] Visit Date: 04/22/2021              Requested by: Billie Ruddy, MD [place],  [place] [id number] PCP: Billie Ruddy, MD   Assessment & Plan: Visit Diagnoses:  1. Chronic pain of right knee     Plan: Based on findings I am concerned that she has a medial meniscal tear.  She is had a prior cortisone injection at an urgent care which gave her temporary relief.  Given the effusion and mechanical symptoms we will need to evaluate for a medial meniscal tear with an MRI.   Follow-Up Instructions: Return for Follow-up after the MRI..   Orders:  No orders of the defined types were placed in this encounter.  No orders of the defined types were placed in this encounter.     Procedures: No procedures performed   Clinical Data: No additional findings.   Subjective: Chief Complaint  Patient presents with  . Right Knee - Pain    Patient is a very pleasant 45 year old female comes in for evaluation of acute on chronic right knee pain.  She has had this problem for quite some time and back in 2016 she went to urgent care for the same issue which resolved for couple years after she had an aspiration and cortisone injection.  Unfortunately she started having problems again with the right knee.  She endorses severe pain on the medial side with increased standing and walking and activity.  She has persistent effusion.  She has start up pain and stiffness.  She feels a stabbing pain on the medial side of the knee.  She endorses mechanical symptoms.  She works at [employer].   Review of Systems  Constitutional: Negative.   HENT: Negative.   Eyes: Negative.   Respiratory: Negative.   Cardiovascular: Negative.   Endocrine: Negative.   Musculoskeletal: Negative.   Neurological: Negative.   Hematological: Negative.   Psychiatric/Behavioral:  Negative.   All other systems reviewed and are negative.    Objective: Vital Signs: There were no vitals taken for this visit.  Physical Exam Vitals and nursing note reviewed.  Constitutional:      Appearance: She is well-developed.  HENT:     Head: Normocephalic and atraumatic.  Pulmonary:     Effort: Pulmonary effort is normal.  Abdominal:     Palpations: Abdomen is soft.  Musculoskeletal:     Cervical back: Neck supple.  Skin:    General: Skin is warm.     Capillary Refill: Capillary refill takes less than 2 seconds.  Neurological:     Mental Status: She is alert and oriented to person, place, and time.  Psychiatric:        Behavior: Behavior normal.        Thought Content: Thought content normal.        Judgment: Judgment normal.     Ortho Exam Right knee shows a small effusion.  She has exquisite medial joint line tenderness with positive McMurray's sign.  Pain with knee flexion past 90 degrees.  Collaterals and cruciates are stable.  Minimal patellofemoral crepitus. Specialty Comments:  No specialty comments available.  Imaging: No results found.   PMFS History: Patient Active Problem List   Diagnosis Date Noted  .  S/P myomectomy 12/14/2018  . Positive urine pregnancy test 07/13/2018  . Symptomatic anemia 07/04/2018  . Pelvic pain 07/04/2018  . Uterine fibroid 07/04/2018  . Ovarian cyst 07/04/2018  . Seasonal and perennial allergic rhinitis 12/08/2017  . Heart murmur 12/09/2013  . Acute blood loss anemia 12/04/2013  . Menorrhagia 12/04/2013   Past Medical History:  Diagnosis Date  . Anemia   . Blood transfusion without reported diagnosis 06/2018   [facility] - 2 units transfused  . Endometrial polyp    Benign  . Heart murmur    as a child, never has caused any problems  . HSV infection   . SVD (spontaneous vaginal delivery)    x 2    Family History  Problem Relation Age of Onset  . Eczema Mother     Past Surgical History:  Procedure Laterality  Date  . D&C x 2     Polyp removal 2011 by Dr. Cheri Rous in [place]  . DILATION AND CURETTAGE OF UTERUS    . Hysterosalpingography    . iron infusions    . MYOMECTOMY N/A 12/14/2018   Procedure: Exploratory Laparotomy MYOMECTOMY;  Surgeon: Servando Salina, MD;  Location: [facility];  Service: Gynecology;  Laterality: N/A;  2hrs.  . WISDOM TOOTH EXTRACTION     Social History   Occupational History  . Not on file  Tobacco Use  . Smoking status: Never Smoker  . Smokeless tobacco: Never Used  Vaping Use  . Vaping Use: Never used  Substance and Sexual Activity  . Alcohol use: Yes    Comment: social wine  . Drug use: No  . Sexual activity: Yes    Birth control/protection: Pill

## 2021-04-24 ENCOUNTER — Telehealth: Payer: Self-pay

## 2021-04-24 ENCOUNTER — Other Ambulatory Visit: Payer: Self-pay

## 2021-04-24 DIAGNOSIS — G8929 Other chronic pain: Secondary | ICD-10-CM

## 2021-04-24 NOTE — Telephone Encounter (Signed)
Patient called she is requesting a referral to be sent to [facility] call back:[phone]

## 2021-04-24 NOTE — Telephone Encounter (Signed)
APPT made they will call her to schedule MRI

## 2021-05-07 ENCOUNTER — Ambulatory Visit
Admission: RE | Admit: 2021-05-07 | Discharge: 2021-05-07 | Disposition: A | Discharge status: Home / Self Care | Payer: 59 | Source: Ambulatory Visit | Attending: Orthopaedic Surgery | Admitting: Orthopaedic Surgery

## 2021-05-07 ENCOUNTER — Other Ambulatory Visit: Payer: Self-pay

## 2021-05-07 DIAGNOSIS — G8929 Other chronic pain: Secondary | ICD-10-CM

## 2021-05-12 ENCOUNTER — Ambulatory Visit: Payer: 59 | Admitting: Orthopaedic Surgery

## 2021-05-12 ENCOUNTER — Other Ambulatory Visit: Payer: Self-pay

## 2021-05-12 ENCOUNTER — Encounter: Payer: Self-pay | Admitting: Orthopaedic Surgery

## 2021-05-12 DIAGNOSIS — S83241A Other tear of medial meniscus, current injury, right knee, initial encounter: Secondary | ICD-10-CM | POA: Diagnosis not present

## 2021-05-12 NOTE — Progress Notes (Signed)
Office Visit Note   Patient: Emily Myers           Date of Birth: [birth year]           MRN: [id number] Visit Date: 05/12/2021              Requested by: Billie Ruddy, MD [place],  [place] [address] PCP: Billie Ruddy, MD   Assessment & Plan: Visit Diagnoses:  1. Acute medial meniscal tear, right, initial encounter     Plan: MRI shows a medial meniscal root tear that is near complete.  There is slight extrusion of the medial meniscus.  Advanced patellofemoral chondromalacia.  Based on these findings I have recommended a medial meniscal root repair versus debridement and partial medial meniscectomy as well as debridements as indicated.  I have recommended that she continue to use a knee brace for support.  She is traveling to [facility] at the end of July and would like to schedule surgery for sometime after she gets back.  Risk benefits rehab recovery reviewed with the patient detail.  Follow-Up Instructions: Return for Postop.   Orders:  No orders of the defined types were placed in this encounter.  No orders of the defined types were placed in this encounter.     Procedures: No procedures performed   Clinical Data: No additional findings.   Subjective: Chief Complaint  Patient presents with  . Right Knee - Pain    Patient returns today for MRI review of the right knee.  Denies any changes.  Recently got back from [place].  Planning to go to [place] at the end of July.  She has been wearing a copper fit compression knee brace.  Pain continues to be on the medial side of the knee.   Review of Systems  Constitutional: Negative.   HENT: Negative.   Eyes: Negative.   Respiratory: Negative.   Cardiovascular: Negative.   Endocrine: Negative.   Musculoskeletal: Negative.   Neurological: Negative.   Hematological: Negative.   Psychiatric/Behavioral: Negative.   All other systems reviewed and are negative.    Objective: Vital  Signs: There were no vitals taken for this visit.  Physical Exam Vitals and nursing note reviewed.  Constitutional:      Appearance: She is well-developed.  Pulmonary:     Effort: Pulmonary effort is normal.  Skin:    General: Skin is warm.     Capillary Refill: Capillary refill takes less than 2 seconds.  Neurological:     Mental Status: She is alert and oriented to person, place, and time.  Psychiatric:        Behavior: Behavior normal.        Thought Content: Thought content normal.        Judgment: Judgment normal.     Ortho Exam Right knee shows medial joint tenderness and positive McMurray.  Trace effusion. Specialty Comments:  No specialty comments available.  Imaging: No results found.   PMFS History: Patient Active Problem List   Diagnosis Date Noted  . S/P myomectomy 12/14/2018  . Positive urine pregnancy test 07/13/2018  . Symptomatic anemia 07/04/2018  . Pelvic pain 07/04/2018  . Uterine fibroid 07/04/2018  . Ovarian cyst 07/04/2018  . Seasonal and perennial allergic rhinitis 12/08/2017  . Heart murmur 12/09/2013  . Acute blood loss anemia 12/04/2013  . Menorrhagia 12/04/2013   Past Medical History:  Diagnosis Date  . Anemia   . Blood transfusion without reported diagnosis 06/2018  [place] - 2 units transfused  . Endometrial polyp    Benign  . Heart murmur    as a child, never has caused any problems  . HSV infection   . SVD (spontaneous vaginal delivery)    x 2    Family History  Problem Relation Age of Onset  . Eczema Mother     Past Surgical History:  Procedure Laterality Date  . D&C x 2     Polyp removal 2011 by Dr. Cheri Rous in [place]  . DILATION AND CURETTAGE OF UTERUS    . Hysterosalpingography    . iron infusions    . MYOMECTOMY N/A 12/14/2018   Procedure: Exploratory Laparotomy MYOMECTOMY;  Surgeon: Servando Salina, MD;  Location: [facility];  Service: Gynecology;  Laterality: N/A;  2hrs.  . WISDOM TOOTH EXTRACTION     Social  History   Occupational History  . Not on file  Tobacco Use  . Smoking status: Never Smoker  . Smokeless tobacco: Never Used  Vaping Use  . Vaping Use: Never used  Substance and Sexual Activity  . Alcohol use: Yes    Comment: social wine  . Drug use: No  . Sexual activity: Yes    Birth control/protection: Pill

## 2021-06-28 ENCOUNTER — Other Ambulatory Visit: Payer: Self-pay | Admitting: Family Medicine

## 2021-06-28 DIAGNOSIS — E559 Vitamin D deficiency, unspecified: Secondary | ICD-10-CM

## 2021-07-28 ENCOUNTER — Other Ambulatory Visit: Payer: Self-pay | Admitting: Physician Assistant

## 2021-07-28 MED ORDER — HYDROCODONE-ACETAMINOPHEN 5-325 MG PO TABS: 1.0000 | ORAL_TABLET | Freq: Three times a day (TID) | ORAL | 0 refills | Status: DC | PRN

## 2021-07-28 MED ORDER — ONDANSETRON HCL 4 MG PO TABS: 4.0000 mg | ORAL_TABLET | Freq: Three times a day (TID) | ORAL | 0 refills | Status: DC | PRN

## 2021-07-30 ENCOUNTER — Encounter: Payer: Self-pay | Admitting: Orthopaedic Surgery

## 2021-07-30 ENCOUNTER — Other Ambulatory Visit: Payer: Self-pay | Admitting: Physician Assistant

## 2021-07-30 DIAGNOSIS — M2341 Loose body in knee, right knee: Secondary | ICD-10-CM | POA: Insufficient documentation

## 2021-07-30 DIAGNOSIS — M659 Synovitis and tenosynovitis, unspecified: Secondary | ICD-10-CM

## 2021-07-30 DIAGNOSIS — M94261 Chondromalacia, right knee: Secondary | ICD-10-CM

## 2021-08-06 ENCOUNTER — Encounter: Payer: Self-pay | Admitting: Orthopaedic Surgery

## 2021-08-06 ENCOUNTER — Telehealth: Payer: Self-pay | Admitting: Orthopaedic Surgery

## 2021-08-06 ENCOUNTER — Other Ambulatory Visit: Payer: Self-pay

## 2021-08-06 ENCOUNTER — Ambulatory Visit (INDEPENDENT_AMBULATORY_CARE_PROVIDER_SITE_OTHER): Payer: 59 | Admitting: Physician Assistant

## 2021-08-06 VITALS — Ht 62.0 in | Wt 217.0 lb

## 2021-08-06 DIAGNOSIS — Z9889 Other specified postprocedural states: Secondary | ICD-10-CM

## 2021-08-06 NOTE — Progress Notes (Signed)
Post-Op Visit Note   Patient: Emily Myers           Date of Birth: [birth year]           MRN: [id number] Visit Date: 08/06/2021 PCP: Billie Ruddy, MD   Assessment & Plan:  Chief Complaint:  Chief Complaint  Patient presents with   Right Knee - Follow-up    Right knee arthroscopy 07/30/2021   Visit Diagnoses:  1. S/P arthroscopy of right knee     Plan: Patient is a pleasant 45 year old female who comes in today 1 week status post right knee arthroscopic debridement, synovectomy, loose body removal and microfracture medial femoral condyle, date of surgery 07/30/2021.  It was noted during operative mention that she had grade 4 changes to the medial femoral condyle in addition to grade 3 changes to the lateral femoral trochlea.  She has been compliant protected weightbearing using crutches over the past week.  She is taking over-the-counter pain medication as needed.  Examination of the right knee reveals well-healed surgical portals without complication.  Nylon sutures in place.  Calf is soft and nontender.  She is neurovascular intact distally.  Today, sutures were removed and Steri-Strips applied.  Range of motion exercises provided.  Intraoperative pictures reviewed.  Her to remain protected weightbearing using crutches for another 5 weeks.  Follow-up with [place] in 5 weeks time for recheck.  Call with concerns or questions in the meantime.  Follow-Up Instructions: Return in about 5 weeks (around 09/10/2021).   Orders:  No orders of the defined types were placed in this encounter.  No orders of the defined types were placed in this encounter.   Imaging: No new imaging  PMFS History: Patient Active Problem List   Diagnosis Date Noted   Chondral loose body of right knee joint 07/30/2021   Chondromalacia of medial femoral condyle, right 07/30/2021   S/P myomectomy 12/14/2018   Positive urine pregnancy test 07/13/2018   Symptomatic anemia 07/04/2018   Pelvic pain  07/04/2018   Uterine fibroid 07/04/2018   Ovarian cyst 07/04/2018   Seasonal and perennial allergic rhinitis 12/08/2017   Heart murmur 12/09/2013   Acute blood loss anemia 12/04/2013   Menorrhagia 12/04/2013   Past Medical History:  Diagnosis Date   Anemia    Blood transfusion without reported diagnosis 06/2018   [facility] - 2 units transfused   Endometrial polyp    Benign   Heart murmur    as a child, never has caused any problems   HSV infection    SVD (spontaneous vaginal delivery)    x 2    Family History  Problem Relation Age of Onset   Eczema Mother     Past Surgical History:  Procedure Laterality Date   D&C x 2     Polyp removal 2011 by Dr. Cheri Rous in [place]     Hysterosalpingography     iron infusions     MYOMECTOMY N/A 12/14/2018   Procedure: Exploratory Laparotomy MYOMECTOMY;  Surgeon: Servando Salina, MD;  Location: [facility];  Service: Gynecology;  Laterality: N/A;  2hrs.   WISDOM TOOTH EXTRACTION     Social History   Occupational History   Not on file  Tobacco Use   Smoking status: Never   Smokeless tobacco: Never  Vaping Use   Vaping Use: Never used  Substance and Sexual Activity   Alcohol use: Yes    Comment: social wine   Drug use: No  Sexual activity: Yes    Birth control/protection: Pill

## 2021-08-06 NOTE — Telephone Encounter (Signed)
Pt's husband Hart Carwin submitted medical release form, Short term disability papers, $250.00 cash payment. Accepted 10/06/21

## 2021-09-10 ENCOUNTER — Encounter: Payer: Self-pay | Admitting: Orthopaedic Surgery

## 2021-09-10 ENCOUNTER — Ambulatory Visit (INDEPENDENT_AMBULATORY_CARE_PROVIDER_SITE_OTHER): Payer: 59 | Admitting: Orthopaedic Surgery

## 2021-09-10 ENCOUNTER — Other Ambulatory Visit: Payer: Self-pay

## 2021-09-10 DIAGNOSIS — S83241A Other tear of medial meniscus, current injury, right knee, initial encounter: Secondary | ICD-10-CM

## 2021-09-10 DIAGNOSIS — Z9889 Other specified postprocedural states: Secondary | ICD-10-CM

## 2021-09-10 NOTE — Progress Notes (Signed)
Post-Op Visit Note   Patient: Emily Myers           Date of Birth: May 08, 1976           MRN: [id number] Visit Date: 09/10/2021 PCP: Billie Ruddy, MD   Assessment & Plan:  Chief Complaint:  Chief Complaint  Patient presents with   Right Knee - Pain   Visit Diagnoses:  1. S/P arthroscopy of right knee   2. Acute medial meniscal tear, right, initial encounter     Plan: Ms. Schouten is 6 weeks status post right knee scope and microfracture.  She is doing well overall is pain when she goes up and down stairs and when she kneels directly on the knee.  She is ambulating with crutches.  She is still very limited in terms of her ability to walk any significant distances.  She has been out of work since surgery.  Feels like there is a popping in the knee but this is painless.  Right knee shows fully healed surgical scars.  Range of motion is adequate.  No bony tenderness.  Small effusion.  She would like to let the effusion resolve on its own if possible.  She is still limited in terms of her ability to return back to work based on parking and the amount of standing that she has to do and she definitely needs more time to recuperate therefore I will write her out of work for another 6 weeks.  Handicap placard renewed today.  Follow-Up Instructions: Return in about 6 weeks (around 10/22/2021).   Orders:  No orders of the defined types were placed in this encounter.  No orders of the defined types were placed in this encounter.   Imaging: No results found.  PMFS History: Patient Active Problem List   Diagnosis Date Noted   Chondral loose body of right knee joint 07/30/2021   Chondromalacia of medial femoral condyle, right 07/30/2021   S/P myomectomy 12/14/2018   Positive urine pregnancy test 07/13/2018   Symptomatic anemia 07/04/2018   Pelvic pain 07/04/2018   Uterine fibroid 07/04/2018   Ovarian cyst 07/04/2018   Seasonal and perennial allergic rhinitis  12/08/2017   Heart murmur 12/09/2013   Acute blood loss anemia 12/04/2013   Menorrhagia 12/04/2013   Past Medical History:  Diagnosis Date   Anemia    Blood transfusion without reported diagnosis 06/2018   [facility] - 2 units transfused   Endometrial polyp    Benign   Heart murmur    as a child, never has caused any problems   HSV infection    SVD (spontaneous vaginal delivery)    x 2    Family History  Problem Relation Age of Onset   Eczema Mother     Past Surgical History:  Procedure Laterality Date   D&C x 2     Polyp removal 2011 by Dr. Cheri Rous in [place]     Hysterosalpingography     iron infusions     MYOMECTOMY N/A 12/14/2018   Procedure: Exploratory Laparotomy MYOMECTOMY;  Surgeon: Servando Salina, MD;  Location: [facility];  Service: Gynecology;  Laterality: N/A;  2hrs.   WISDOM TOOTH EXTRACTION     Social History   Occupational History   Not on file  Tobacco Use   Smoking status: Never   Smokeless tobacco: Never  Vaping Use   Vaping Use: Never used  Substance and Sexual Activity   Alcohol use: Yes  Comment: social wine   Drug use: No   Sexual activity: Yes    Birth control/protection: Pill

## 2021-10-15 ENCOUNTER — Encounter: Payer: Self-pay | Admitting: Orthopaedic Surgery

## 2021-10-15 ENCOUNTER — Telehealth: Payer: Self-pay | Admitting: Orthopaedic Surgery

## 2021-10-15 ENCOUNTER — Ambulatory Visit (INDEPENDENT_AMBULATORY_CARE_PROVIDER_SITE_OTHER): Payer: 59 | Admitting: Orthopaedic Surgery

## 2021-10-15 ENCOUNTER — Other Ambulatory Visit: Payer: Self-pay

## 2021-10-15 DIAGNOSIS — S83241A Other tear of medial meniscus, current injury, right knee, initial encounter: Secondary | ICD-10-CM | POA: Diagnosis not present

## 2021-10-15 DIAGNOSIS — M94261 Chondromalacia, right knee: Secondary | ICD-10-CM

## 2021-10-15 DIAGNOSIS — Z9889 Other specified postprocedural states: Secondary | ICD-10-CM

## 2021-10-15 MED ORDER — LIDOCAINE HCL 1 % IJ SOLN
2.0000 mL | INTRAMUSCULAR | Status: AC | PRN
  Administered 2021-10-15: 2 mL

## 2021-10-15 MED ORDER — METHYLPREDNISOLONE ACETATE 40 MG/ML IJ SUSP
40.0000 mg | INTRAMUSCULAR | Status: AC | PRN
  Administered 2021-10-15: 40 mg via INTRA_ARTICULAR

## 2021-10-15 MED ORDER — BUPIVACAINE HCL 0.5 % IJ SOLN
2.0000 mL | INTRAMUSCULAR | Status: AC | PRN
  Administered 2021-10-15: 2 mL via INTRA_ARTICULAR

## 2021-10-15 NOTE — Telephone Encounter (Signed)
Received $25.00 cash,disability paperwork and medical records release form   Forwarded to [facility] today

## 2021-10-15 NOTE — Progress Notes (Signed)
Office Visit Note   Patient: Emily Myers           Date of Birth: 17-Nov-1976           MRN: [id number] Visit Date: 10/15/2021              Requested by: Billie Ruddy, MD [place],  [place] [address] PCP: Billie Ruddy, MD   Assessment & Plan: Visit Diagnoses:  1. S/P arthroscopy of right knee   2. Chondromalacia of medial femoral condyle, right   3. Acute medial meniscal tear, right, initial encounter     Plan: Ms. Rann is about 6 weeks status post right knee arthroscopy partial medial meniscectomy and microfracture.  I saw her about a month ago and she had an effusion but we decided to hold off on the aspiration to see if it will get better but unfortunately this has not.  She is going to [place] tomorrow and would like to have this aspirated.  She feels stiffness and tightness at all times and has difficulty using steps.  Denies any constitutional symptoms.  She denies any real pain just symptoms related to the effusion.  The right knee was aspirated today and I obtained about 50 cc of synovial fluid.  Cortisone was injected as well.  She will follow-up with me if her symptoms persist or if the relief is temporary.  Follow-Up Instructions: No follow-ups on file.   Orders:  No orders of the defined types were placed in this encounter.  No orders of the defined types were placed in this encounter.     Procedures: Large Joint Inj: R knee on 10/15/2021 9:58 PM Indications: pain Details: 22 G needle  Arthrogram: No  Medications: 40 mg methylPREDNISolone acetate 40 MG/ML; 2 mL lidocaine 1 %; 2 mL bupivacaine 0.5 % Consent was given by the patient. Patient was prepped and draped in the usual sterile fashion.      Clinical Data: No additional findings.   Subjective: Chief Complaint  Patient presents with  . Right Knee - Pain    HPI  Review of Systems   Objective: Vital Signs: There were no vitals taken for this  visit.  Physical Exam  Ortho Exam  Specialty Comments:  No specialty comments available.  Imaging: No results found.   PMFS History: Patient Active Problem List   Diagnosis Date Noted  . Chondral loose body of right knee joint 07/30/2021  . Chondromalacia of medial femoral condyle, right 07/30/2021  . S/P myomectomy 12/14/2018  . Positive urine pregnancy test 07/13/2018  . Symptomatic anemia 07/04/2018  . Pelvic pain 07/04/2018  . Uterine fibroid 07/04/2018  . Ovarian cyst 07/04/2018  . Seasonal and perennial allergic rhinitis 12/08/2017  . Heart murmur 12/09/2013  . Acute blood loss anemia 12/04/2013  . Menorrhagia 12/04/2013   Past Medical History:  Diagnosis Date  . Anemia   . Blood transfusion without reported diagnosis 06/2018   [facility] - 2 units transfused  . Endometrial polyp    Benign  . Heart murmur    as a child, never has caused any problems  . HSV infection   . SVD (spontaneous vaginal delivery)    x 2    Family History  Problem Relation Age of Onset  . Eczema Mother     Past Surgical History:  Procedure Laterality Date  . D&C x 2     Polyp removal 2011 by Dr. Cheri Rous in [place]  . DILATION AND  CURETTAGE OF UTERUS    . Hysterosalpingography    . iron infusions    . MYOMECTOMY N/A 12/14/2018   Procedure: Exploratory Laparotomy MYOMECTOMY;  Surgeon: Servando Salina, MD;  Location: [facility];  Service: Gynecology;  Laterality: N/A;  2hrs.  . WISDOM TOOTH EXTRACTION     Social History   Occupational History  . Not on file  Tobacco Use  . Smoking status: Never  . Smokeless tobacco: Never  Vaping Use  . Vaping Use: Never used  Substance and Sexual Activity  . Alcohol use: Yes    Comment: social wine  . Drug use: No  . Sexual activity: Yes    Birth control/protection: Pill

## 2021-11-12 ENCOUNTER — Telehealth: Payer: Self-pay | Admitting: Family Medicine

## 2021-11-12 DIAGNOSIS — Z1211 Encounter for screening for malignant neoplasm of colon: Secondary | ICD-10-CM

## 2021-11-12 NOTE — Telephone Encounter (Signed)
Pt mom had polyps and pt is 45 years old and would like to know if dr banks would put order in for her to have colonoscopy. Pt has [employer]

## 2021-11-12 NOTE — Telephone Encounter (Signed)
Referral placed, spoke with pt, is aware they will be reaching out to schedule.

## 2021-11-16 ENCOUNTER — Ambulatory Visit (INDEPENDENT_AMBULATORY_CARE_PROVIDER_SITE_OTHER): Payer: 59 | Admitting: Family Medicine

## 2021-11-16 ENCOUNTER — Encounter: Payer: Self-pay | Admitting: Family Medicine

## 2021-11-16 VITALS — BP 134/82 | HR 61 | Temp 98.4°F | Wt 226.4 lb

## 2021-11-16 DIAGNOSIS — E7841 Elevated Lipoprotein(a): Secondary | ICD-10-CM | POA: Diagnosis not present

## 2021-11-16 DIAGNOSIS — G8929 Other chronic pain: Secondary | ICD-10-CM

## 2021-11-16 DIAGNOSIS — E559 Vitamin D deficiency, unspecified: Secondary | ICD-10-CM | POA: Diagnosis not present

## 2021-11-16 DIAGNOSIS — M25561 Pain in right knee: Secondary | ICD-10-CM

## 2021-11-16 DIAGNOSIS — D500 Iron deficiency anemia secondary to blood loss (chronic): Secondary | ICD-10-CM

## 2021-11-16 LAB — CBC WITH DIFFERENTIAL/PLATELET
Basophils Absolute: 0.1 10*3/uL (ref 0.0–0.1)
Basophils Relative: 1.4 % (ref 0.0–3.0)
Eosinophils Absolute: 0.3 10*3/uL (ref 0.0–0.7)
Eosinophils Relative: 4.5 % (ref 0.0–5.0)
HCT: 32.7 % — ABNORMAL LOW (ref 36.0–46.0)
Hemoglobin: 10.4 g/dL — ABNORMAL LOW (ref 12.0–15.0)
Lymphocytes Relative: 25.8 % (ref 12.0–46.0)
Lymphs Abs: 1.6 10*3/uL (ref 0.7–4.0)
MCHC: 31.7 g/dL (ref 30.0–36.0)
MCV: 79.5 fl (ref 78.0–100.0)
Monocytes Absolute: 0.7 10*3/uL (ref 0.1–1.0)
Monocytes Relative: 10.6 % (ref 3.0–12.0)
Neutro Abs: 3.7 10*3/uL (ref 1.4–7.7)
Neutrophils Relative %: 57.7 % (ref 43.0–77.0)
Platelets: 333 10*3/uL (ref 150.0–400.0)
RBC: 4.11 Mil/uL (ref 3.87–5.11)
RDW: 16 % — ABNORMAL HIGH (ref 11.5–15.5)
WBC: 6.3 10*3/uL (ref 4.0–10.5)

## 2021-11-16 LAB — LIPID PANEL
Cholesterol: 201 mg/dL — ABNORMAL HIGH (ref 0–200)
HDL: 60.9 mg/dL (ref >39.00)
LDL Cholesterol: 126 mg/dL — ABNORMAL HIGH (ref 0–99)
NonHDL: 140.21
Total CHOL/HDL Ratio: 3
Triglycerides: 69 mg/dL (ref 0.0–149.0)
VLDL: 13.8 mg/dL (ref 0.0–40.0)

## 2021-11-16 LAB — VITAMIN D 25 HYDROXY (VIT D DEFICIENCY, FRACTURES): VITD: 19.95 ng/mL — ABNORMAL LOW (ref 30.00–100.00)

## 2021-11-16 NOTE — Progress Notes (Signed)
Subjective:    Patient ID: Emily Myers, female    DOB: 1976-10-16, 45 y.o.   MRN: [id number]  Chief Complaint  Patient presents with   Follow-up    Lab work    HPI Patient was seen today for f/u on abnormal labs.  Pt with anemia, hgb 10.8 on 04/08/21 and vit D 22.05.  Taking iron.  States did not notice a difference after taking ergocalciferol 50,000 IU versus OTC vitamin D.  Patient also states she would like lipid panel rechecked as she is fasting this morning.  Since last OFV patient has seen Ortho for right knee pain.  Status post arthroscopy for torn meniscus, bone spurs.  Patient states knee was feeling great after surgery but it started to become painful again.  Patient endorses knee swelling again and becoming painful and up stairs.  After surgery patient had right knee drained.  Patient also having continued left shoulder pain for which she plans to see Ortho.  Patient has mammogram tomorrow and eye appointment in the next week.  Pt wants to lose weight, but cannot exercise 2/2 knee pain.  Past Medical History:  Diagnosis Date   Anemia    Blood transfusion without reported diagnosis 06/2018   [place] - 2 units transfused   Endometrial polyp    Benign   Heart murmur    as a child, never has caused any problems   HSV infection    SVD (spontaneous vaginal delivery)    x 2    Allergies  Allergen Reactions   Aspirin Hives    ROS General: Denies fever, chills, night sweats, changes in weight, changes in appetite  +weight gain  HEENT: Denies headaches, ear pain, changes in vision, rhinorrhea, sore throat CV: Denies CP, palpitations, SOB, orthopnea Pulm: Denies SOB, cough, wheezing GI: Denies abdominal pain, nausea, vomiting, diarrhea, constipation GU: Denies dysuria, hematuria, frequency, vaginal discharge Msk: Denies muscle cramps, joint pains + R knee pain, L shoulder pain, L knee Neuro: Denies weakness, numbness, tingling Skin: Denies rashes, bruising Psych: Denies  depression, anxiety, hallucinations    Objective:    Blood pressure 134/82, pulse 61, temperature 98.4 F (36.9 C), temperature source Oral, weight 226 lb 6.4 oz (102.7 kg), SpO2 99 %, unknown if currently breastfeeding. Body mass index is 41.41 kg/m.  Gen. Pleasant, well-nourished, in no distress, central obesity, normal affect  HEENT: [place]/AT, face symmetric, conjunctiva clear, no scleral icterus, PERRLA, EOMI, nares patent without drainage. Lungs: no accessory muscle use, CTAB, no wheezes or rales Cardiovascular: RRR, no m/r/g, no peripheral edema Musculoskeletal: Mild right knee edema.  No deformities, no cyanosis or clubbing, normal tone Neuro:  A&Ox3, CN II-XII intact, normal gait Skin:  Warm, no lesions/ rash   Wt Readings from Last 3 Encounters:  11/16/21 226 lb 6.4 oz (102.7 kg)  08/06/21 217 lb (98.4 kg)  04/08/21 217 lb 12.8 oz (98.8 kg)    Lab Results  Component Value Date   WBC 5.8 04/08/2021   HGB 10.8 (L) 04/08/2021   HCT 34.5 (L) 04/08/2021   PLT 364.0 04/08/2021   GLUCOSE 76 04/08/2021   CHOL 169 04/08/2021   TRIG 72.0 04/08/2021   HDL 54.70 04/08/2021   LDLCALC 100 (H) 04/08/2021   ALT 12 07/04/2018   AST 14 (L) 08/11/2018   NA 138 04/08/2021   K 4.6 04/08/2021   CL 105 04/08/2021   CREATININE 0.74 04/08/2021   BUN 14 04/08/2021   CO2 27 04/08/2021   TSH 1.60 04/08/2021  HGBA1C 5.4 04/08/2021    Assessment/Plan:  Iron deficiency anemia due to chronic blood loss  -hgb 10.8 on 04/08/2021 -Continue OTC iron supplements and eating iron rich foods - Plan: CBC with Differential/Platelet, Iron, TIBC and Ferritin Panel  Vitamin D deficiency -Recheck vitamin D pt status post ergo Calciferol 50,000 IU x 12 weeks -Given handout  - Plan: Vitamin D, 25-hydroxy  Elevated lipoprotein(a)  -LDL cholesterol minimally elevated on 04/08/2021 -Continue lifestyle modifications -Recheck at patient's request - Plan: Lipid panel  Chronic pain of right  knee -Status post arthroscopy 07/30/2021 for debridement, synovectomy, loose body removal and microfracture medial femoral condyle. -Discussed continuing supportive care for recent edema -Continue follow-up with Ortho  Morbid obesity (HCC) -BMI 41.41 -Discussed chair exercises given current right knee pain -Given handout -Also consider water aerobics -Discussed weight loss medications  F/u as needed  Grier Mitts, MD

## 2021-11-17 ENCOUNTER — Other Ambulatory Visit: Payer: Self-pay

## 2021-11-17 ENCOUNTER — Encounter: Payer: Self-pay | Admitting: Orthopaedic Surgery

## 2021-11-17 ENCOUNTER — Ambulatory Visit (INDEPENDENT_AMBULATORY_CARE_PROVIDER_SITE_OTHER): Payer: 59 | Admitting: Orthopaedic Surgery

## 2021-11-17 DIAGNOSIS — M7051 Other bursitis of knee, right knee: Secondary | ICD-10-CM

## 2021-11-17 LAB — IRON,TIBC AND FERRITIN PANEL
%SAT: 5 % — ABNORMAL LOW (ref 16–45)
Ferritin: 5 ng/mL — ABNORMAL LOW (ref 16–232)
Iron: 23 ug/dL — ABNORMAL LOW (ref 40–190)
TIBC: 434 ug/dL (ref 250–450)

## 2021-11-17 MED ORDER — BUPIVACAINE HCL 0.25 % IJ SOLN
2.0000 mL | INTRAMUSCULAR | Status: AC | PRN
  Administered 2021-11-17: 2 mL via INTRA_ARTICULAR

## 2021-11-17 MED ORDER — LIDOCAINE HCL 1 % IJ SOLN
2.0000 mL | INTRAMUSCULAR | Status: AC | PRN
Start: 2021-11-17 — End: 2021-11-17
  Administered 2021-11-17: 2 mL

## 2021-11-17 MED ORDER — METHYLPREDNISOLONE ACETATE 40 MG/ML IJ SUSP
40.0000 mg | INTRAMUSCULAR | Status: AC | PRN
  Administered 2021-11-17: 40 mg via INTRA_ARTICULAR

## 2021-11-17 NOTE — Progress Notes (Signed)
Office Visit Note   Patient: Emily Myers           Date of Birth: 17-Aug-1976           MRN: [id number] Visit Date: 11/17/2021              Requested by: Billie Ruddy, MD [place],  [place] [address] PCP: Billie Ruddy, MD   Assessment & Plan: Visit Diagnoses:  1. Pes anserinus bursitis of right knee     Plan: Impression is recurrent right knee pain predominantly coming from the pes bursa.  Today, we proceeded with a cortisone injection to this area.  We have also discussed intra-articular viscosupplementation injection if her symptoms do not improve following the above injection.  She will call and let us know.  Otherwise, follow-up with [place] as needed.  This patient is diagnosed with osteoarthritis of the knee(s).    Radiographs show evidence of joint space narrowing, osteophytes, subchondral sclerosis and/or subchondral cysts.  This patient has knee pain which interferes with functional and activities of daily living.    This patient has experienced inadequate response, adverse effects and/or intolerance with conservative treatments such as acetaminophen, NSAIDS, topical creams, physical therapy or regular exercise, knee bracing and/or weight loss.   This patient has experienced inadequate response or has a contraindication to intra articular steroid injections for at least 3 months.   This patient is not scheduled to have a total knee replacement within 6 months of starting treatment with viscosupplementation.   Follow-Up Instructions: Return if symptoms worsen or fail to improve.   Orders:  Orders Placed This Encounter  Procedures   Large Joint Inj   No orders of the defined types were placed in this encounter.     Procedures: Large Joint Inj: R knee on 11/17/2021 9:01 AM Indications: pain Details: 22 G needle, anterolateral approach Medications: 2 mL lidocaine 1 %; 2 mL bupivacaine 0.25 %; 40 mg methylPREDNISolone acetate 40  MG/ML     Clinical Data: No additional findings.   Subjective: Chief Complaint  Patient presents with   Right Knee - Pain    HPI patient is a pleasant 45 year old female who comes in today with recurrent right knee pain.  She has little less than 3 months status post right knee arthroscopic debridement medial meniscus and microfracture.  She was doing okay until about 4 weeks ago when she had recurrent pain and an effusion.  She was seen in our office where the right knee was aspirated and injected with cortisone.  This helped for a few weeks.  The pain has returned and is primarily located around the pes bursa.  Pain is worse with walking as well as going from a seated to standing position.  She denies any mechanical symptoms.  No new injury.  Review of Systems as detailed in HPI.  All others reviewed and are negative.   Objective: Vital Signs: There were no vitals taken for this visit.  Physical Exam well-developed well-nourished female no acute distress.  Alert and oriented x3.  Ortho Exam right knee exam shows no effusion.  Range of motion 0 to 120 degrees.  Minimal medial joint line tenderness.  Moderate tenderness to the pes bursa.  She is neurovascularly intact distally.  Specialty Comments:  No specialty comments available.  Imaging: No new imaging   PMFS History: Patient Active Problem List   Diagnosis Date Noted   Chondral loose body of right knee joint 07/30/2021  Chondromalacia of medial femoral condyle, right 07/30/2021   S/P myomectomy 12/14/2018   Positive urine pregnancy test 07/13/2018   Symptomatic anemia 07/04/2018   Pelvic pain 07/04/2018   Uterine fibroid 07/04/2018   Ovarian cyst 07/04/2018   Seasonal and perennial allergic rhinitis 12/08/2017   Heart murmur 12/09/2013   Acute blood loss anemia 12/04/2013   Menorrhagia 12/04/2013   Past Medical History:  Diagnosis Date   Anemia    Blood transfusion without reported diagnosis 06/2018   [facility] -  2 units transfused   Endometrial polyp    Benign   Heart murmur    as a child, never has caused any problems   HSV infection    SVD (spontaneous vaginal delivery)    x 2    Family History  Problem Relation Age of Onset   Eczema Mother     Past Surgical History:  Procedure Laterality Date   D&C x 2     Polyp removal 2011 by Dr. Cheri Rous in [place]     Hysterosalpingography     iron infusions     MYOMECTOMY N/A 12/14/2018   Procedure: Exploratory Laparotomy MYOMECTOMY;  Surgeon: Servando Salina, MD;  Location: [facility];  Service: Gynecology;  Laterality: N/A;  2hrs.   WISDOM TOOTH EXTRACTION     Social History   Occupational History   Not on file  Tobacco Use   Smoking status: Never   Smokeless tobacco: Never  Vaping Use   Vaping Use: Never used  Substance and Sexual Activity   Alcohol use: Yes    Comment: social wine   Drug use: No   Sexual activity: Yes    Birth control/protection: Pill

## 2021-11-18 ENCOUNTER — Other Ambulatory Visit: Payer: Self-pay | Admitting: Family Medicine

## 2021-11-18 DIAGNOSIS — E559 Vitamin D deficiency, unspecified: Secondary | ICD-10-CM

## 2021-11-18 MED ORDER — VITAMIN D (ERGOCALCIFEROL) 1.25 MG (50000 UNIT) PO CAPS
50000.0000 [IU] | ORAL_CAPSULE | ORAL | 0 refills | Status: DC
Start: 2021-11-18 — End: 2022-04-06

## 2022-02-07 ENCOUNTER — Other Ambulatory Visit: Payer: Self-pay | Admitting: Family Medicine

## 2022-02-07 DIAGNOSIS — E559 Vitamin D deficiency, unspecified: Secondary | ICD-10-CM

## 2022-02-16 ENCOUNTER — Ambulatory Visit: Payer: 59 | Admitting: Family Medicine

## 2022-02-16 ENCOUNTER — Encounter: Payer: Self-pay | Admitting: Family Medicine

## 2022-02-16 VITALS — BP 126/80 | HR 63 | Resp 16 | Ht 62.0 in | Wt 223.0 lb

## 2022-02-16 DIAGNOSIS — H11421 Conjunctival edema, right eye: Secondary | ICD-10-CM

## 2022-02-16 DIAGNOSIS — H1013 Acute atopic conjunctivitis, bilateral: Secondary | ICD-10-CM | POA: Diagnosis not present

## 2022-02-16 MED ORDER — NEOMYCIN-POLYMYXIN-HC 3.5-10000-1 OP SUSP: 2.0000 [drp] | Freq: Three times a day (TID) | OPHTHALMIC | 0 refills | Status: AC

## 2022-02-16 MED ORDER — PAZEO 0.7 % OP SOLN: 1.0000 [drp] | Freq: Every day | OPHTHALMIC | 2 refills | Status: DC

## 2022-02-16 NOTE — Patient Instructions (Addendum)
A few things to remember from today's visit:  Conjunctival edema of right eye - Plan: neomycin-polymyxin-hydrocortisone (CORTISPORIN) 3.5-10000-1 ophthalmic suspension  Allergic conjunctivitis, bilateral - Plan: Olopatadine HCl (PAZEO) 0.7 % SOLN  Do not use My Chart to request refills or for acute issues that need immediate attention.   Right eye vision was abnormal, so please arrange appt with your eye care provider. Right eye steroid and antibiotic drops for just 7 days. Monitor for new symptoms.  Please be sure medication list is accurate. If a new problem present, please set up appointment sooner than planned today.  Allergic Conjunctivitis, Adult Allergic conjunctivitis is inflammation of the clear membrane (conjunctiva) that covers the white part of your eye and the inner surface of your eyelid. This condition can make your eye red or pink. It can also make your eye feel itchy. This condition cannot be spread from one person to another person (is not contagious). What are the causes? This condition is caused by allergens. These are things that can cause an allergic reaction in some people but not in others. Common allergens include: Outdoor allergens, such as: Pollen, including pollen from grass and weeds. Mold. Car fumes. Indoor allergens, such as: Dust. Smoke. Mold. Proteins in a pet's pee (urine), saliva, or dander. What increases the risk? You are more likely to develop this condition if you have a family history of these things: Allergies. Conditions that you get because of allergens, such as asthma or inflammation of the skin (eczema). What are the signs or symptoms? Symptoms of this condition include eyes that are: Itchy. Red. Watery. Puffy. Your eyes may also: Sting or burn. Have clear fluid draining from them. Have thick mucus coming from them. How is this treated? This condition may be treated with: Cold, wet cloths (cold compresses) to soothe itching and  swelling. Washing the face to remove allergens. Eye drops. These may include: Eye drops that block allergies. Eye drops that reduce swelling and irritation. Steroid eye drops if other treatments have not worked. Oral antihistamine medicines. These medicines lessen your allergies. You may need these if eye drops do not help or are difficult to use. Follow these instructions at home: Eye care Place a cool, clean washcloth on your eye for 10-20 minutes. Do this 3-4 times a day. Do not touch or rub your eyes. Do not wear contact lenses until the inflammation is gone. Wear glasses instead. Do not wear eye makeup until the inflammation is gone. General instructions Try not to be around things that you are allergic to. Take or apply over-the-counter and prescription medicines only as told by your doctor. These include any eye drops. Drink enough fluid to keep your pee pale yellow. Keep all follow-up visits as told by your doctor. This is important. Contact a doctor if: Your symptoms get worse. Your symptoms do not get better with treatment. You have mild eye pain. You are sensitive to light. You have spots or blisters on your eyes. You have pus coming from your eye. You have a fever. Get help right away if: You have redness, swelling, or other symptoms in only one eye. You cannot see well. You have other vision changes. You have very bad eye pain. Summary Allergic conjunctivitis is caused by allergens. It can make your eye red or pink, and it can make your eye feel itchy. This condition cannot be spread from one person to another person (is not contagious). Try not to be around things that you are allergic to.  Take or apply over-the-counter and prescription medicines only as told by your doctor. These include any eye drops. Contact your doctor if your symptoms get worse or they do not get better with treatment. This information is not intended to replace advice given to you by your  health care provider. Make sure you discuss any questions you have with your health care provider. Document Revised: 11/05/2019 Document Reviewed: 11/05/2019 Elsevier Patient Education  2022 [employer].

## 2022-02-16 NOTE — Progress Notes (Signed)
ACUTE VISIT Chief Complaint  Patient presents with   eye irritation    Started on Friday, worse at night. Unsure if it's pink eye, had something similar in December.    HPI: [name] is a 46 y.o. female, who is here today complaining of bilateral conjunctival erythema and epiphora, right is worse. Initially Emily Myers felt like Emily Myers had an eyelash in her eye, foreign body sensation. Conjunctivitis  The current episode started 3 to 5 days ago. The onset was gradual. Associated symptoms include eye itching, congestion, rhinorrhea and eye redness. Pertinent negatives include no fever, no decreased vision, no double vision, no photophobia, no abdominal pain, no nausea, no vomiting, no ear discharge, no ear pain, no headaches, no hearing loss, no mouth sores, no sore throat, no stridor, no swollen glands, no muscle aches, no cough, no rash, no eye discharge and no eye pain.  11/2021 similar symptoms, Emily Myers had an eye exam and examination was negative. Emily Myers has used OTC eye drops, "redness relief;"which helped with symptoms in 11/2021 but is not helping at this time.  Sometimes bilateral eye pruritus. "Watery" eyes. Emily Myers has not identified exacerbating or alleviating factors. 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  No purulent discharge. No significant changes in vision. Symptoms are worse at night and in the morning. No sick contact or recent URI. No hx of trauma.  Emily Myers takes daily OTC antihistaminic for allergies.  Review of Systems  Constitutional:  Negative for activity change, appetite change, chills and fever.  HENT:  Positive for congestion and rhinorrhea. Negative for ear discharge, ear pain, hearing loss, mouth sores and sore throat.   Eyes:  Positive for redness and itching. Negative for double vision, photophobia, pain and discharge.  Respiratory:  Negative for cough, shortness of breath and stridor.   Gastrointestinal:  Negative for  abdominal pain, nausea and vomiting.  Skin:  Negative for rash.  Allergic/Immunologic: Positive for environmental allergies.  Neurological:  Negative for syncope, weakness and headaches.  Hematological:  Negative for adenopathy. Does not bruise/bleed easily.  Rest see pertinent positives and negatives per HPI.  Current Outpatient Medications on File Prior to Visit  Medication Sig Dispense Refill   calcium-vitamin D 250-100 MG-UNIT tablet Take 1 tablet by mouth daily.     Vitamin D, Ergocalciferol, (DRISDOL) 1.25 MG (50000 UNIT) CAPS capsule Take 1 capsule (50,000 Units total) by mouth every 7 (seven) days. 12 capsule 0   ferrous sulfate 325 (65 FE) MG tablet Take 1 tablet (325 mg total) by mouth daily with breakfast. Increase to twice daily as tolerated (Patient taking differently: Take 650 mg by mouth 2 (two) times daily with a meal.) 60 tablet 0   No current facility-administered medications on file prior to visit.   Past Medical History:  Diagnosis Date   Anemia    Blood transfusion without reported diagnosis 06/2018   [place] - 2 units transfused   Endometrial polyp    Benign   Heart murmur    as a child, never has caused any problems   HSV infection    SVD (spontaneous vaginal delivery)    x 2   Allergies  Allergen Reactions   Aspirin Hives    Social History   Socioeconomic History   Marital status: Married    Spouse name: Not on file   Number of children: 2   Years of education: Not on file   Highest education level: Not on  file  Occupational History   Not on file  Tobacco Use   Smoking status: Never   Smokeless tobacco: Never  Vaping Use   Vaping Use: Never used  Substance and Sexual Activity   Alcohol use: Yes    Comment: social wine   Drug use: No   Sexual activity: Yes    Birth control/protection: Pill  Other Topics Concern   Not on file  Social History Narrative   45 yo AAF who lives at home with her stepmom and her 2 daughters ages 76 and 61. Emily Myers is a  never smoker, social alcohol drinker described as 1-2 drinks a month, denies illicit drug use. Emily Myers has some college education and works as a Loss adjuster, chartered at [employer]. Emily Myers recently moved from [place] to [place] 4 months ago and has no PCP or OB/GYN established.   Social Determinants of Health   Financial Resource Strain: Not on file  Food Insecurity: Not on file  Transportation Needs: Not on file  Physical Activity: Not on file  Stress: Not on file  Social Connections: Not on file    Vitals:   02/16/22 1206  BP: 126/80  Pulse: 63  Resp: 16  SpO2: 99%   Body mass index is 40.79 kg/m.  Physical Exam Vitals and nursing note reviewed.  HENT:     Head: Normocephalic and atraumatic.     Nose: Congestion and rhinorrhea present.     Mouth/Throat:     Mouth: Mucous membranes are moist.  Eyes:     General: Lids are everted, no foreign bodies appreciated. No allergic shiner or scleral icterus.       Right eye: No foreign body or discharge.        Left eye: No foreign body or discharge.     Extraocular Movements: Extraocular movements intact.     Conjunctiva/sclera:     Right eye: Right conjunctiva is injected. No exudate or hemorrhage.    Left eye: Left conjunctiva is injected (Minimal). No exudate or hemorrhage.    Pupils: Pupils are equal, round, and reactive to light.     Funduscopic exam:    Right eye: No hemorrhage or exudate. Red reflex present.        Left eye: No hemorrhage or exudate. Red reflex present.    Comments: Epiphora and conjunctival erythema, R>>L Right mild conjunctival edema.  Cardiovascular:     Rate and Rhythm: Normal rate and regular rhythm.     Heart sounds: No murmur heard. Pulmonary:     Effort: Pulmonary effort is normal. No respiratory distress.     Breath sounds: Normal breath sounds.  Lymphadenopathy:     Head:     Right side of head: No posterior auricular adenopathy.     Left side of head: No posterior auricular  adenopathy.     Cervical: No cervical adenopathy.  Skin:    General: Skin is warm.     Findings: No rash.  Neurological:     General: No focal deficit present.     Mental Status: Emily Myers is alert and oriented to person, place, and time.     Gait: Gait normal.  Psychiatric:        Mood and Affect: Mood and affect normal.   ASSESSMENT AND PLAN:  Ms.Meara was seen today for eye irritation.  Diagnoses and all orders for this visit:  Conjunctival edema of right eye We discussed possible etiologies. Hx and most of examination do not suggest a serious  infectious process. Right vision mildly abnormal. Emily Myers thinks Emily Myers may be able to see her eye care provider this week. Ophthalmic cortisporin x 7 days recommended, some side effects discussed. Clearly instructed about warning signs.  Vision Screening   Right eye Left eye Both eyes  Without correction     With correction 20/50 20/20 20/20    -     neomycin-polymyxin-hydrocortisone (CORTISPORIN) 3.5-10000-1 ophthalmic suspension; Place 2 drops into the right eye 3 (three) times daily for 7 days.  Allergic conjunctivitis, bilateral We discussed Dx,prognosis,and treatment options. Recommend ophthalmic antihistaminic.  -     Olopatadine HCl (PAZEO) 0.7 % SOLN; Apply 1 drop to eye daily.  Return in about 3 days (around 02/19/2022).  Joron Velis G. Martinique, MD  [facility]. [place] office.

## 2022-02-18 ENCOUNTER — Encounter: Payer: Self-pay | Admitting: Internal Medicine

## 2022-03-16 ENCOUNTER — Other Ambulatory Visit: Payer: Self-pay

## 2022-03-16 ENCOUNTER — Ambulatory Visit (AMBULATORY_SURGERY_CENTER): Payer: 59 | Admitting: *Deleted

## 2022-03-16 VITALS — Ht 62.0 in | Wt 226.0 lb

## 2022-03-16 DIAGNOSIS — Z1211 Encounter for screening for malignant neoplasm of colon: Secondary | ICD-10-CM

## 2022-03-16 NOTE — Progress Notes (Signed)

## 2022-03-24 ENCOUNTER — Ambulatory Visit: Payer: 59 | Admitting: Orthopaedic Surgery

## 2022-03-24 ENCOUNTER — Other Ambulatory Visit: Payer: Self-pay

## 2022-03-24 DIAGNOSIS — G8929 Other chronic pain: Secondary | ICD-10-CM

## 2022-03-24 DIAGNOSIS — M25561 Pain in right knee: Secondary | ICD-10-CM | POA: Diagnosis not present

## 2022-03-24 NOTE — Progress Notes (Signed)
? ?Office Visit Note ?  ?Patient: Emily Myers           ?Date of Birth: 1976/06/09           ?MRN: [id number] ?Visit Date: 03/24/2022 ?             ?Requested by: Billie Ruddy, MD ?[address] ?[place],  [place] [address] ?PCP: Billie Ruddy, MD ? ? ?Assessment & Plan: ?Visit Diagnoses:  ?1. Chronic pain of right knee   ? ? ?Plan: Impression is recurrent right knee pain and effusion from underlying advanced osteoarthritis.  I believe the pain to the pes bursa is likely coming from her ambulating with an altered gait.  We have discussed getting approval for viscosupplementation injection to the right knee as she has only had temporary relief from intra-articular cortisone injection and prescription NSAIDs.  She is agreeable to this plan and will follow-up with [place] once approved.  Call with concerns or questions in the meantime. ? ?This patient is diagnosed with osteoarthritis of the knee(s).   ? ?Radiographs show evidence of joint space narrowing, osteophytes, subchondral sclerosis and/or subchondral cysts.  This patient has knee pain which interferes with functional and activities of daily living.   ? ?This patient has experienced inadequate response, adverse effects and/or intolerance with conservative treatments such as acetaminophen, NSAIDS, topical creams, physical therapy or regular exercise, knee bracing and/or weight loss.  ? ?This patient has experienced inadequate response or has a contraindication to intra articular steroid injections for at least 3 months.  ? ?This patient is not scheduled to have a total knee replacement within 6 months of starting treatment with viscosupplementation. ? ? ?Follow-Up Instructions: Return if symptoms worsen or fail to improve.  ? ?Orders:  ?No orders of the defined types were placed in this encounter. ? ?No orders of the defined types were placed in this encounter. ? ? ? ? Procedures: ?No procedures performed ? ? ?Clinical Data: ?No additional  findings. ? ? ?Subjective: ?Chief Complaint  ?Patient presents with  ? Right Knee - Pain  ? ? ?HPI patient is a pleasant 46 year old female who comes in today with recurrent right knee pain and effusion.  She underwent right knee arthroscopic debridement medial meniscus, chondroplasty and microfracture medial femoral condyle 07/30/2021.  He was noted during operative intervention she had grade 4 changes to the medial femoral condyle and grade 3 changes to the trochlea.  She continues to have pain and swelling and underwent right knee aspiration and cortisone injection in October 2022.  She came back in November with pain around the pes bursa.  This was injected with cortisone.  This helped until about a week ago.  She is complaining of pain around the pes bursa in addition to recurrent right knee effusion.  Symptoms appear to be worse towards the end of the day as well as when she is trying to kneel on the right knee.  She denies any new injury. ? ?Review of Systems as detailed in HPI.  All others reviewed and are negative. ? ? ?Objective: ?Vital Signs: There were no vitals taken for this visit. ? ?Physical Exam well-developed well-nourished female no acute distress.  Alert and oriented x3. ? ?Ortho Exam right knee exam shows moderate effusion.  Range of motion 0 to 110 degrees.  No joint line tenderness.  Mild patellar crepitus.  Moderate tenderness to the pes bursa with associated swelling.  She is stable valgus varus stress.  She is neurovascular  intact distally. ? ?Specialty Comments:  ?No specialty comments available. ? ?Imaging: ?No new imaging ? ? ?PMFS History: ?Patient Active Problem List  ? Diagnosis Date Noted  ? Chondral loose body of right knee joint 07/30/2021  ? Chondromalacia of medial femoral condyle, right 07/30/2021  ? S/P myomectomy 12/14/2018  ? Positive urine pregnancy test 07/13/2018  ? Symptomatic anemia 07/04/2018  ? Pelvic pain 07/04/2018  ? Uterine fibroid 07/04/2018  ? Ovarian cyst  07/04/2018  ? Seasonal and perennial allergic rhinitis 12/08/2017  ? Heart murmur 12/09/2013  ? Acute blood loss anemia 12/04/2013  ? Menorrhagia 12/04/2013  ? ?Past Medical History:  ?Diagnosis Date  ? Anemia   ? Blood transfusion without reported diagnosis 06/2018  ? [employer] - 2 units transfused  ? Endometrial polyp   ? Benign  ? Heart murmur   ? as a child, never has caused any problems  ? HSV infection   ? SVD (spontaneous vaginal delivery)   ? x 2  ?  ?Family History  ?Problem Relation Age of Onset  ? Colon polyps Mother   ? Eczema Mother   ? Colon cancer Neg Hx   ? Esophageal cancer Neg Hx   ? Stomach cancer Neg Hx   ? Rectal cancer Neg Hx   ?  ?Past Surgical History:  ?Procedure Laterality Date  ? D&C x 2    ? Polyp removal 2011 by Dr. Cheri Rous in [place]  ? DILATION AND CURETTAGE OF UTERUS    ? Hysterosalpingography    ? iron infusions    ? KNEE ARTHROSCOPY W/ DEBRIDEMENT  2022  ? MYOMECTOMY N/A 12/14/2018  ? Procedure: Exploratory Laparotomy MYOMECTOMY;  Surgeon: Servando Salina, MD;  Location: [facility];  Service: Gynecology;  Laterality: N/A;  2hrs.  ? WISDOM TOOTH EXTRACTION    ? ?Social History  ? ?Occupational History  ? Not on file  ?Tobacco Use  ? Smoking status: Never  ? Smokeless tobacco: Never  ?Vaping Use  ? Vaping Use: Never used  ?Substance and Sexual Activity  ? Alcohol use: Yes  ?  Comment: social wine  ? Drug use: No  ? Sexual activity: Yes  ?  Birth control/protection: Pill  ? ? ? ? ? ? ?

## 2022-03-29 ENCOUNTER — Encounter: Payer: Self-pay | Admitting: Internal Medicine

## 2022-04-06 ENCOUNTER — Encounter: Payer: Self-pay | Admitting: Internal Medicine

## 2022-04-06 ENCOUNTER — Ambulatory Visit (AMBULATORY_SURGERY_CENTER): Payer: 59 | Admitting: Internal Medicine

## 2022-04-06 VITALS — BP 124/56 | HR 60 | Temp 96.4°F | Resp 16 | Ht 62.0 in | Wt 226.0 lb

## 2022-04-06 DIAGNOSIS — Z1211 Encounter for screening for malignant neoplasm of colon: Secondary | ICD-10-CM

## 2022-04-06 MED ORDER — SODIUM CHLORIDE 0.9 % IV SOLN: 500.0000 mL | Freq: Once | INTRAVENOUS | Status: DC

## 2022-04-06 NOTE — Progress Notes (Signed)
Pt non-responsive, VVS, Report to RN  °

## 2022-04-06 NOTE — Op Note (Signed)
[facility] ?Patient Name: Emily Myers ?Procedure Date: 04/06/2022 8:40 AM ?MRN: [id number] ?Endoscopist: Gatha Mayer , MD ?Age: 46 ?Referring MD:  ?Date of Birth: 05/15/1976 ?Gender: Female ?Account #: [id number] ?Procedure:                Colonoscopy ?Indications:              Screening for colorectal malignant neoplasm, This  ?                          is the patient's first colonoscopy ?Medicines:                Monitored Anesthesia Care ?Procedure:                Pre-Anesthesia Assessment: ?                          - Prior to the procedure, a History and Physical  ?                          was performed, and patient medications and  ?                          allergies were reviewed. The patient's tolerance of  ?                          previous anesthesia was also reviewed. The risks  ?                          and benefits of the procedure and the sedation  ?                          options and risks were discussed with the patient.  ?                          All questions were answered, and informed consent  ?                          was obtained. Prior Anticoagulants: The patient has  ?                          taken no previous anticoagulant or antiplatelet  ?                          agents. ASA Grade Assessment: III - A patient with  ?                          severe systemic disease. After reviewing the risks  ?                          and benefits, the patient was deemed in  ?                          satisfactory condition to undergo the procedure. ?  After obtaining informed consent, the colonoscope  ?                          was passed under direct vision. Throughout the  ?                          procedure, the patient's blood pressure, pulse, and  ?                          oxygen saturations were monitored continuously. The  ?                          Olympus Scope [id number] was introduced through the  ?                          anus and advanced to  the the cecum, identified by  ?                          appendiceal orifice and ileocecal valve. The  ?                          colonoscopy was somewhat difficult due to  ?                          significant looping. Successful completion of the  ?                          procedure was aided by applying abdominal pressure.  ?                          The patient tolerated the procedure well. The  ?                          quality of the bowel preparation was excellent. The  ?                          ileocecal valve, appendiceal orifice, and rectum  ?                          were photographed. The bowel preparation used was  ?                          Miralax via split dose instruction. ?Scope In: 8:44:32 AM ?Scope Out: 8:58:58 AM ?Scope Withdrawal Time: 0 hours 9 minutes 42 seconds  ?Total Procedure Duration: 0 hours 14 minutes 26 seconds  ?Findings:                 The perianal and digital rectal examinations were  ?                          normal. ?                          A few diverticula were found in the ascending colon. ?  The exam was otherwise without abnormality on  ?                          direct and retroflexion views. ?Complications:            No immediate complications. ?Estimated Blood Loss:     Estimated blood loss: none. ?Impression:               - Diverticulosis in the ascending colon. ?                          - The examination was otherwise normal on direct  ?                          and retroflexion views. ?                          - No specimens collected. ?Recommendation:           - Patient has a contact number available for  ?                          emergencies. The signs and symptoms of potential  ?                          delayed complications were discussed with the  ?                          patient. Return to normal activities tomorrow.  ?                          Written discharge instructions were provided to the  ?                           patient. ?                          - Resume previous diet. ?                          - Continue present medications. ?                          - Repeat colonoscopy in 10 years for screening  ?                          purposes. ?Gatha Mayer, MD ?04/06/2022 9:05:19 AM ?This report has been signed electronically. ?

## 2022-04-06 NOTE — Progress Notes (Signed)
VS-CW  Pt's states no medical or surgical changes since previsit or office visit.  

## 2022-04-06 NOTE — Patient Instructions (Addendum)
No polyps or cancer seen. ? ?You do have a condition called diverticulosis - common and not usually a problem. Please read the handout provided. ? ?Next routine colonoscopy or other screening test in 10 years - 2033. ? ?I appreciate the opportunity to care for you. ?Gatha Mayer, MD, [facility] ? ?YOU HAD AN ENDOSCOPIC PROCEDURE TODAY AT [facility]:   Refer to the procedure report that was given to you for any specific questions about what was found during the examination.  If the procedure report does not answer your questions, please call your gastroenterologist to clarify.  If you requested that your care partner not be given the details of your procedure findings, then the procedure report has been included in a sealed envelope for you to review at your convenience later. ? ?YOU SHOULD EXPECT: Some feelings of bloating in the abdomen. Passage of more gas than usual.  Walking can help get rid of the air that was put into your GI tract during the procedure and reduce the bloating. If you had a lower endoscopy (such as a colonoscopy or flexible sigmoidoscopy) you may notice spotting of blood in your stool or on the toilet paper. If you underwent a bowel prep for your procedure, you may not have a normal bowel movement for a few days. ? ?Please Note:  You might notice some irritation and congestion in your nose or some drainage.  This is from the oxygen used during your procedure.  There is no need for concern and it should clear up in a day or so. ? ?SYMPTOMS TO REPORT IMMEDIATELY: ? ?Following lower endoscopy (colonoscopy or flexible sigmoidoscopy): ? Excessive amounts of blood in the stool ? Significant tenderness or worsening of abdominal pains ? Swelling of the abdomen that is new, acute ? Fever of 100?F or higher ? ? ?For urgent or emergent issues, a gastroenterologist can be reached at any hour by calling [phone]. ?Do not use MyChart messaging for urgent concerns.  ? ? ?DIET:  We do  recommend a small meal at first, but then you may proceed to your regular diet.  Drink plenty of fluids but you should avoid alcoholic beverages for 24 hours. ? ?ACTIVITY:  You should plan to take it easy for the rest of today and you should NOT DRIVE or use heavy machinery until tomorrow (because of the sedation medicines used during the test).   ? ?FOLLOW UP: ?Our staff will call the number listed on your records 48-72 hours following your procedure to check on you and address any questions or concerns that you may have regarding the information given to you following your procedure. If we do not reach you, we will leave a message.  We will attempt to reach you two times.  During this call, we will ask if you have developed any symptoms of COVID 19. If you develop any symptoms (ie: fever, flu-like symptoms, shortness of breath, cough etc.) before then, please call [phone].  If you test positive for Covid 19 in the 2 weeks post procedure, please call and report this information to [place].   ? ?If any biopsies were taken you will be contacted by phone or by letter within the next 1-3 weeks.  Please call us at [phone] if you have not heard about the biopsies in 3 weeks.  ? ? ?SIGNATURES/CONFIDENTIALITY: ?You and/or your care partner have signed paperwork which will be entered into your electronic medical record.  These signatures attest to  the fact that that the information above on your After Visit Summary has been reviewed and is understood.  Full responsibility of the confidentiality of this discharge information lies with you and/or your care-partner.  ?

## 2022-04-06 NOTE — Progress Notes (Signed)
[facility] History and Physical ? ? ?Primary Care Physician:  Billie Ruddy, MD ? ? ?Reason for Procedure:   CRCA screening ? ?Plan:    colonoscopy ? ? ? ? ?HPI: Emily Myers is a 46 y.o. female here for CRCA screening exam ? ? ?Past Medical History:  ?Diagnosis Date  ? Anemia   ? Blood transfusion without reported diagnosis 06/2018  ? [facility] - 2 units transfused  ? Endometrial polyp   ? Benign  ? Heart murmur   ? as a child, never has caused any problems  ? HSV infection   ? SVD (spontaneous vaginal delivery)   ? x 2  ? ? ?Past Surgical History:  ?Procedure Laterality Date  ? D&C x 2    ? Polyp removal 2011 by Dr. Cheri Rous in [place]  ? DILATION AND CURETTAGE OF UTERUS    ? Hysterosalpingography    ? iron infusions    ? KNEE ARTHROSCOPY W/ DEBRIDEMENT  2022  ? MYOMECTOMY N/A 12/14/2018  ? Procedure: Exploratory Laparotomy MYOMECTOMY;  Surgeon: Servando Salina, MD;  Location: [facility];  Service: Gynecology;  Laterality: N/A;  2hrs.  ? WISDOM TOOTH EXTRACTION    ? ? ?Prior to Admission medications   ?Medication Sig Start Date End Date Taking? Authorizing Provider  ?ferrous sulfate 325 (65 FE) MG tablet Take 1 tablet (325 mg total) by mouth daily with breakfast. Increase to twice daily as tolerated ?Patient taking differently: Take 650 mg by mouth 2 (two) times daily with a meal. 07/05/18 04/06/22 Yes Patrecia Pour, MD  ? ? ?Current Outpatient Medications  ?Medication Sig Dispense Refill  ? ferrous sulfate 325 (65 FE) MG tablet Take 1 tablet (325 mg total) by mouth daily with breakfast. Increase to twice daily as tolerated (Patient taking differently: Take 650 mg by mouth 2 (two) times daily with a meal.) 60 tablet 0  ? ?Current Facility-Administered Medications  ?Medication Dose Route Frequency Provider Last Rate Last Admin  ? 0.9 %  sodium chloride infusion  500 mL Intravenous Once Gatha Mayer, MD      ? ? ?Allergies as of 04/06/2022 - Review Complete 04/06/2022  ?Allergen Reaction  Noted  ? Aspirin Hives 12/04/2013  ? ? ?Family History  ?Problem Relation Age of Onset  ? Colon polyps Mother   ? Eczema Mother   ? Colon cancer Neg Hx   ? Esophageal cancer Neg Hx   ? Stomach cancer Neg Hx   ? Rectal cancer Neg Hx   ? ? ?Social History  ? ?Socioeconomic History  ? Marital status: Married  ?  Spouse name: Not on file  ? Number of children: 2  ? Years of education: Not on file  ? Highest education level: Not on file  ?Occupational History  ? Not on file  ?Tobacco Use  ? Smoking status: Never  ? Smokeless tobacco: Never  ?Vaping Use  ? Vaping Use: Never used  ?Substance and Sexual Activity  ? Alcohol use: Yes  ?  Comment: social wine  ? Drug use: No  ? Sexual activity: Yes  ?  Birth control/protection: Pill  ?Other Topics Concern  ? Not on file  ?Social History Narrative  ? 46 yo AAF who lives at home with her stepmom and her 2 daughters ages 50 and 48. She is a never smoker, social alcohol drinker described as 1-2 drinks a month, denies illicit drug use. She has some college education and works as a Loss adjuster, chartered  at [employer]. She recently moved from [place] to [place] 4 months ago and has no PCP or OB/GYN established.  ? ?Social Determinants of Health  ? ?Financial Resource Strain: Not on file  ?Food Insecurity: Not on file  ?Transportation Needs: Not on file  ?Physical Activity: Not on file  ?Stress: Not on file  ?Social Connections: Not on file  ?Intimate Partner Violence: Not on file  ? ? ?Review of Systems: ? ?All other review of systems negative except as mentioned in the HPI. ? ?Physical Exam: ?Vital signs ?BP (!) 150/88   Pulse 62   Temp (!) 96.4 ?F (35.8 ?C) (Temporal)   Ht '5\' 2"'$  (1.575 m)   Wt 226 lb (102.5 kg)   LMP 03/29/2022 (Approximate)   SpO2 100%   BMI 41.34 kg/m?  ? ?General:   Alert,  Well-developed, well-nourished, pleasant and cooperative in NAD ?Lungs:  Clear throughout to auscultation.   ?Heart:  Regular rate and rhythm; no murmurs, clicks, rubs,   or gallops. ?Abdomen:  Soft, nontender and nondistended. Normal bowel sounds.   ?Neuro/Psych:  Alert and cooperative. Normal mood and affect. A and O x 3 ? ? ?'@Brya Simerly'$  Simonne Maffucci, MD, [facility] ?[facility] ?[phone] (pager) ?04/06/2022 8:33 AM@ ? ?

## 2022-04-08 ENCOUNTER — Telehealth: Payer: Self-pay | Admitting: *Deleted

## 2022-04-08 ENCOUNTER — Telehealth: Payer: Self-pay

## 2022-04-08 NOTE — Telephone Encounter (Signed)
?  Follow up Call- ? ? ?  04/06/2022  ?  7:48 AM  ?Call back number  ?Post procedure Call Back phone  # [phone]  ?Permission to leave phone message Yes  ?  ?No answer at 2nd attempt follow up phone call.  Left message on voicemail.   ?

## 2022-04-08 NOTE — Telephone Encounter (Signed)
?  Follow up Call- ? ? ?  04/06/2022  ?  7:48 AM  ?Call back number  ?Post procedure Call Back phone  # [phone]  ?Permission to leave phone message Yes  ?  ? ?Patient questions: ? ?Do you have a fever, pain , or abdominal swelling? No. ?Pain Score  0 * ? ?Have you tolerated food without any problems? Yes ? ?Have you been able to return to your normal activities? Yes.   ? ?Do you have any questions about your discharge instructions: ?Diet   Yes.   ?Medications  Yes.   ?Follow up visit  Yes.   ? ?Do you have questions or concerns about your Care? No. ? ?Actions: ?* If pain score is 4 or above: ?No action needed, pain <4. ? ? ?

## 2022-04-08 NOTE — Telephone Encounter (Signed)
First attempt follow up call to pt, lm for pt to call if having any problems or questions, otherwise we will call them back later this morning or early this afternoon.  °

## 2022-08-02 ENCOUNTER — Ambulatory Visit (INDEPENDENT_AMBULATORY_CARE_PROVIDER_SITE_OTHER): Payer: 59 | Admitting: Family Medicine

## 2022-08-02 VITALS — BP 126/90 | HR 71 | Temp 98.8°F | Ht 66.5 in | Wt 222.4 lb

## 2022-08-02 DIAGNOSIS — G8929 Other chronic pain: Secondary | ICD-10-CM

## 2022-08-02 DIAGNOSIS — E782 Mixed hyperlipidemia: Secondary | ICD-10-CM | POA: Diagnosis not present

## 2022-08-02 DIAGNOSIS — M25561 Pain in right knee: Secondary | ICD-10-CM | POA: Diagnosis not present

## 2022-08-02 DIAGNOSIS — H11421 Conjunctival edema, right eye: Secondary | ICD-10-CM

## 2022-08-02 DIAGNOSIS — R609 Edema, unspecified: Secondary | ICD-10-CM | POA: Diagnosis not present

## 2022-08-02 DIAGNOSIS — Z0000 Encounter for general adult medical examination without abnormal findings: Secondary | ICD-10-CM

## 2022-08-02 LAB — COMPREHENSIVE METABOLIC PANEL
ALT: 10 U/L (ref 0–35)
AST: 13 U/L (ref 0–37)
Albumin: 4.3 g/dL (ref 3.5–5.2)
Alkaline Phosphatase: 71 U/L (ref 39–117)
BUN: 14 mg/dL (ref 6–23)
CO2: 24 mEq/L (ref 19–32)
Calcium: 9.2 mg/dL (ref 8.4–10.5)
Chloride: 106 mEq/L (ref 96–112)
Creatinine, Ser: 0.69 mg/dL (ref 0.40–1.20)
GFR: 104.54 mL/min (ref >60.00)
Glucose, Bld: 87 mg/dL (ref 70–99)
Potassium: 4.3 mEq/L (ref 3.5–5.1)
Sodium: 139 mEq/L (ref 135–145)
Total Bilirubin: 0.3 mg/dL (ref 0.2–1.2)
Total Protein: 7.2 g/dL (ref 6.0–8.3)

## 2022-08-02 LAB — LIPID PANEL
Cholesterol: 192 mg/dL (ref 0–200)
HDL: 57.6 mg/dL (ref >39.00)
LDL Cholesterol: 120 mg/dL — ABNORMAL HIGH (ref 0–99)
NonHDL: 134.61
Total CHOL/HDL Ratio: 3
Triglycerides: 75 mg/dL (ref 0.0–149.0)
VLDL: 15 mg/dL (ref 0.0–40.0)

## 2022-08-02 LAB — CBC WITH DIFFERENTIAL/PLATELET
Basophils Absolute: 0 10*3/uL (ref 0.0–0.1)
Basophils Relative: 0.7 % (ref 0.0–3.0)
Eosinophils Absolute: 0.3 10*3/uL (ref 0.0–0.7)
Eosinophils Relative: 3.7 % (ref 0.0–5.0)
HCT: 28.5 % — ABNORMAL LOW (ref 36.0–46.0)
Hemoglobin: 8.7 g/dL — ABNORMAL LOW (ref 12.0–15.0)
Lymphocytes Relative: 29.5 % (ref 12.0–46.0)
Lymphs Abs: 2.1 10*3/uL (ref 0.7–4.0)
MCHC: 30.5 g/dL (ref 30.0–36.0)
MCV: 70.2 fl — ABNORMAL LOW (ref 78.0–100.0)
Monocytes Absolute: 0.6 10*3/uL (ref 0.1–1.0)
Monocytes Relative: 8.7 % (ref 3.0–12.0)
Neutro Abs: 4.1 10*3/uL (ref 1.4–7.7)
Neutrophils Relative %: 57.4 % (ref 43.0–77.0)
Platelets: 543 10*3/uL — ABNORMAL HIGH (ref 150.0–400.0)
RBC: 4.06 Mil/uL (ref 3.87–5.11)
RDW: 21.2 % — ABNORMAL HIGH (ref 11.5–15.5)
WBC: 7.1 10*3/uL (ref 4.0–10.5)

## 2022-08-02 LAB — TSH: TSH: 2.44 u[IU]/mL (ref 0.35–5.50)

## 2022-08-02 LAB — SEDIMENTATION RATE: Sed Rate: 33 mm/hr — ABNORMAL HIGH (ref 0–20)

## 2022-08-02 LAB — T4, FREE: Free T4: 0.7 ng/dL (ref 0.60–1.60)

## 2022-08-02 LAB — HEMOGLOBIN A1C: Hgb A1c MFr Bld: 5.9 % (ref 4.6–6.5)

## 2022-08-02 NOTE — Patient Instructions (Signed)
Duplicate to schedule your mammogram if you have not already done so.  I have included some information about uveitis type of eye inflammation.   Do not forget to check with your orthopedic provider regarding your knee pain.  If needed a new referral can be placed.  [facility] Services: -to make an appointment contact the office/provider you are interested in seeing.  No referral is needed.  The below is not an all inclusive list, but will help you get started.  [email or web address] -counseling located off of [address].  [email or web address] -website helps you find providers in your area  [facility] -Located off of [address]. across from [address]  Dr. Darleene Cleaver is a Teacher, music with [facility] [employer]. [phone]  [facility]

## 2022-08-02 NOTE — Progress Notes (Signed)
Subjective:     Emily Myers is a 46 y.o. female and is here for a comprehensive physical exam. The patient reports intermittent discomfort in right eye with a erythema, bulge/swelling that appears on the side of eyeball.  Has pictures on her phone of the eye when irritated.  Seen at [facility] on [address].  Pt endorses chronic left knee pain.  Had surgery on R knee but is continuing to have edema/discomfort.  Also notes intermittent LE edema.  Patient was increased standing when doing hair.  Patient seen by Ortho but considering a second opinion.  Patient notes increased stress at home.  Interested in counseling.  Social History   Socioeconomic History   Marital status: Married    Spouse name: Not on file   Number of children: 2   Years of education: Not on file   Highest education level: Not on file  Occupational History   Not on file  Tobacco Use   Smoking status: Never   Smokeless tobacco: Never  Vaping Use   Vaping Use: Never used  Substance and Sexual Activity   Alcohol use: Yes    Comment: social wine   Drug use: No   Sexual activity: Yes    Birth control/protection: Pill  Other Topics Concern   Not on file  Social History Narrative   46 yo AAF who lives at home with her stepmom and her 2 daughters ages 69 and 40. She is a never smoker, social alcohol drinker described as 1-2 drinks a month, denies illicit drug use. She has some college education and works as a Loss adjuster, chartered at [employer]. She recently moved from [place] to [place] 4 months ago and has no PCP or OB/GYN established.   Social Determinants of Health   Financial Resource Strain: Not on file  Food Insecurity: Not on file  Transportation Needs: Not on file  Physical Activity: Not on file  Stress: Not on file  Social Connections: Not on file  Intimate Partner Violence: Not on file   Health Maintenance  Topic Date Due   Hepatitis C Screening  Never done    TETANUS/TDAP  Never done   COVID-19 Vaccine (3 - Pfizer series) 06/09/2020   INFLUENZA VACCINE  07/27/2022   PAP SMEAR-Modifier  10/27/2024   COLONOSCOPY (Pts 45-22yr Insurance coverage will need to be confirmed)  04/06/2032   HIV Screening  Completed   HPV VACCINES  Aged Out    The following portions of the patient's history were reviewed and updated as appropriate: allergies, current medications, past family history, past medical history, past social history, past surgical history, and problem list.  Review of Systems Pertinent items noted in HPI and remainder of comprehensive ROS otherwise negative.   Objective:    BP (!) 126/90 (BP Location: Left Arm, Patient Position: Sitting, Cuff Size: Normal)   Pulse 71   Temp 98.8 F (37.1 C) (Oral)   Ht 5' 6.5" (1.689 m)   Wt 222 lb 6.4 oz (100.9 kg)   SpO2 99%   BMI 35.36 kg/m  General appearance: alert, cooperative, and no distress Head: Normocephalic, without obvious abnormality, atraumatic Eyes: conjunctivae/corneas clear. PERRL, EOM's intact. Fundi benign. Ears: normal TM's and external ear canals both ears Nose: Nares normal. Septum midline. Mucosa normal. No drainage or sinus tenderness. Throat: lips, mucosa, and tongue normal; teeth and gums normal Neck: no adenopathy, no carotid bruit, no JVD, supple, symmetrical, trachea midline, and thyroid not enlarged, symmetric, no  tenderness/mass/nodules Lungs: clear to auscultation bilaterally Heart: regular rate and rhythm, S1, S2 normal, no murmur, click, rub or gallop Abdomen: soft, non-tender; bowel sounds normal; no masses,  no organomegaly Extremities: extremities normal, atraumatic, no cyanosis or edema Pulses: 2+ and symmetric Skin: Skin color, texture, turgor normal. No rashes or lesions Lymph nodes: Cervical, supraclavicular, and axillary nodes normal. Neurologic: Alert and oriented X 3, normal strength and tone. Normal symmetric reflexes. Normal coordination and gait        08/02/2022    9:37 AM 11/16/2021    8:37 AM  Depression screen PHQ 2/9  Decreased Interest 0 0  Down, Depressed, Hopeless 0 0  PHQ - 2 Score 0 0  Altered sleeping 1 0  Tired, decreased energy 1 0  Change in appetite 0 0  Feeling bad or failure about yourself  0 0  Trouble concentrating 0 0  Moving slowly or fidgety/restless 0 0  Suicidal thoughts 0 0  PHQ-9 Score 2 0  Difficult doing work/chores Not difficult at all     Assessment:    Healthy female exam.      Plan:    Anticipatory guidance given including wearing seatbelts, smoke detectors in the home, increasing physical activity, increasing p.o. intake of water and vegetables. -labs -Patient encouraged to schedule mammogram. -Colonoscopy done 04/06/2022 -Discussed immunizations.  Patient declines at this time -Given information for counseling -Given handout -Next CPE in 1 year See After Visit Summary for Counseling Recommendations   Well adult exam - Plan: Hemoglobin A1c, Lipid panel  Chronic pain of right knee -Patient encouraged to follow-up with Ortho.   -Patient to notify clinic if interested in second opinion so referral can be placed.  Peripheral edema  -Supportive care including reducing sodium intake, wearing compression socks or TED hose - Plan: CBC with Differential/Platelet, CMP  Conjunctival edema of right eye  -Discussed possible causes including autoimmune disorders such as iritis or uveitis -Obtain labs -Advised to follow-up with ophthalmology. - Plan: CBC with Differential/Platelet, TSH, T4, Free, Sedimentation Rate, CMP, ANA, HLA-B27 Antigen  Mixed hyperlipidemia -Total cholesterol 201, LDL 126 on 11/16/2021. -Lifestyle modifications - Plan: Lipid panel  Follow-up as needed  Grier Mitts, MD

## 2022-08-03 LAB — HLA-B27 ANTIGEN: HLA-B27 Antigen: NEGATIVE

## 2022-08-03 LAB — ANA: Anti Nuclear Antibody (ANA): NEGATIVE

## 2022-08-11 ENCOUNTER — Other Ambulatory Visit: Payer: Self-pay | Admitting: Family Medicine

## 2022-08-11 DIAGNOSIS — D500 Iron deficiency anemia secondary to blood loss (chronic): Secondary | ICD-10-CM

## 2022-08-19 ENCOUNTER — Encounter: Payer: Self-pay | Admitting: Family Medicine

## 2022-08-22 NOTE — Progress Notes (Unsigned)
Office Visit    Patient Name: Emily Myers Date of Encounter: 08/24/2022  Primary Care Provider:  Billie Ruddy, MD Primary Cardiologist:  Jenkins Rouge, MD Primary Electrophysiologist: None  Chief Complaint    Emily Myers is a 46 y.o. female with PMH of heart murmur (benign) and, anemia who presents for annual follow-up.  Past Medical History    Past Medical History:  Diagnosis Date   Anemia    Blood transfusion without reported diagnosis 06/2018   [facility] - 2 units transfused   Endometrial polyp    Benign   Heart murmur    as a child, never has caused any problems   HSV infection    SVD (spontaneous vaginal delivery)    x 2   Past Surgical History:  Procedure Laterality Date   D&C x 2     Polyp removal 2011 by Dr. Cheri Rous in [place]     Hysterosalpingography     iron infusions     KNEE ARTHROSCOPY W/ DEBRIDEMENT  2022   MYOMECTOMY N/A 12/14/2018   Procedure: Exploratory Laparotomy MYOMECTOMY;  Surgeon: Servando Salina, MD;  Location: [facility];  Service: Gynecology;  Laterality: N/A;  2hrs.   WISDOM TOOTH EXTRACTION      Allergies  Allergies  Allergen Reactions   Aspirin Hives    History of Present Illness    Emily Myers is a 46 year old female with above-mentioned past medical history presents today for annual follow-up of murmur.  She was initially seen by Dr. Johnsie Cancel on 01/26/2021.  She had some lower extremity edema and 2D echo was ordered to evaluate heart and lung function that revealed EF 60-65%, no RWMA, normal valve function.  She was encouraged to elevate extremities when edema occurs and use compression stockings with as needed diuretics.  Since last being seen in the office patient reports she has been doing well and has no new cardiac complaints.  She is continue to have lower extremity edema that is more prominent in the morning and improves throughout the day.  She is having  some sodium indiscretions but overall watches her sodium intake.  She is beginning a new exercise routine at the gym and is riding stationary bike.  She is euvolemic otherwise on examination and denies any orthopnea.  She has not been compliant with wearing compression stockings and was instructed to do so moving forward.  Patient denies chest pain, palpitations, dyspnea, PND, orthopnea, nausea, vomiting, dizziness, syncope, edema, weight gain, or early satiety.  Home Medications    Current Outpatient Medications  Medication Sig Dispense Refill   Chlorpheniramine Maleate (ALLERGY PO) Take 1 tablet by mouth daily.     ferrous sulfate 325 (65 FE) MG tablet Take 1 tablet (325 mg total) by mouth daily with breakfast. Increase to twice daily as tolerated (Patient taking differently: Take 650 mg by mouth 2 (two) times daily with a meal.) 60 tablet 0   FOLIC ACID PO Take 1 tablet by mouth daily.     furosemide (LASIX) 20 MG tablet Take 1 tablet (20 mg total) by mouth daily as needed (swelling). 30 tablet 0   No current facility-administered medications for this visit.     Review of Systems  Please see the history of present illness.    (+) Bilateral lower extremity edema  All other systems reviewed and are otherwise negative except as noted above.  Physical Exam    Wt Readings from Last  3 Encounters:  08/24/22 219 lb 12.8 oz (99.7 kg)  08/02/22 222 lb 6.4 oz (100.9 kg)  04/06/22 226 lb (102.5 kg)   VS: Vitals:   08/24/22 0816  BP: 120/64  Pulse: (!) 57  SpO2: 99%  ,Body mass index is 39.56 kg/m.  Constitutional:      Appearance: Healthy appearance. Not in distress.  Neck:     Vascular: JVD normal.  Pulmonary:     Effort: Pulmonary effort is normal.     Breath sounds: No wheezing. No rales. Diminished in the bases Cardiovascular:     Normal rate. Regular rhythm. Normal S1. Normal S2.      Murmurs: Diastolic present Edema:    Bilateral +2 lower extremity edema and right arm  edema Abdominal:     Palpations: Abdomen is soft non tender. There is no hepatomegaly.  Skin:    General: Skin is warm and dry.  Neurological:     General: No focal deficit present.     Mental Status: Alert and oriented to person, place and time.     Cranial Nerves: Cranial nerves are intact.  EKG/LABS/Other Studies Reviewed    ECG personally reviewed by me today -sinus bradycardia with occasional PACs and rate of 57 with no acute changes present.     Lab Results  Component Value Date   WBC 7.1 08/02/2022   HGB 8.7 (L) 08/02/2022   HCT 28.5 (L) 08/02/2022   MCV 70.2 (L) 08/02/2022   PLT 543.0 (H) 08/02/2022   Lab Results  Component Value Date   CREATININE 0.69 08/02/2022   BUN 14 08/02/2022   NA 139 08/02/2022   K 4.3 08/02/2022   CL 106 08/02/2022   CO2 24 08/02/2022   Lab Results  Component Value Date   ALT 10 08/02/2022   AST 13 08/02/2022   ALKPHOS 71 08/02/2022   BILITOT 0.3 08/02/2022   Lab Results  Component Value Date   CHOL 192 08/02/2022   HDL 57.60 08/02/2022   LDLCALC 120 (H) 08/02/2022   TRIG 75.0 08/02/2022   CHOLHDL 3 08/02/2022    Lab Results  Component Value Date   HGBA1C 5.9 08/02/2022    Assessment & Plan    1.  History of murmur: -2D echo completed 2022 with normal valve function and EF of 55-60%. -Patient advised that SBE prophylaxis not indicated for dental procedures. -Patient advised to continue physical activity with no limitations  2.  Lower extremity edema: -Presents today with 2+ lower extremity edema present -She denies any shortness of breath or chest pain. -She was encouraged to wear compression stockings throughout the day while standing and also elevate legs when dependent -As needed Lasix 20 mg for excess swelling -We will plan to refer for lower extremity Doppler if swelling persist  3.  History of anemia: -Related to heavy menses in the past and improved over time with no recurrence. -Patient encouraged to take as  needed iron supplement -She is currently being treated by PCP  4.  Hypercholesteremia: -Currently followed by PCP with last LDL cholesterol of 120 -Patient is trying lifestyle modifications and increase physical activity   Disposition: Follow-up with Jenkins Rouge, MD or APP in 12 months   Medication Adjustments/Labs and Tests Ordered: Current medicines are reviewed at length with the patient today.  Concerns regarding medicines are outlined above.   Signed, Mable Fill, Marissa Nestle, NP 08/24/2022, 9:40 AM [place]

## 2022-08-24 ENCOUNTER — Encounter: Payer: Self-pay | Admitting: Nurse Practitioner

## 2022-08-24 ENCOUNTER — Ambulatory Visit: Payer: 59 | Attending: Nurse Practitioner | Admitting: Nurse Practitioner

## 2022-08-24 VITALS — BP 120/64 | HR 57 | Ht 62.5 in | Wt 219.8 lb

## 2022-08-24 DIAGNOSIS — R011 Cardiac murmur, unspecified: Secondary | ICD-10-CM

## 2022-08-24 DIAGNOSIS — R609 Edema, unspecified: Secondary | ICD-10-CM | POA: Diagnosis not present

## 2022-08-24 DIAGNOSIS — E7800 Pure hypercholesterolemia, unspecified: Secondary | ICD-10-CM

## 2022-08-24 DIAGNOSIS — D649 Anemia, unspecified: Secondary | ICD-10-CM

## 2022-08-24 MED ORDER — FUROSEMIDE 20 MG PO TABS: 20.0000 mg | ORAL_TABLET | Freq: Every day | ORAL | 0 refills | Status: DC | PRN

## 2022-08-24 NOTE — Patient Instructions (Addendum)
Medication Instructions:  Your physician has recommended you make the following change in your medication:   START Lasix 20 mg taking only as needed for swelling   *If you need a refill on your cardiac medications before your next appointment, please call your pharmacy*   Lab Work: None ordered  If you have labs (blood work) drawn today and your tests are completely normal, you will receive your results only by: [place] (if you have MyChart) OR A paper copy in the mail If you have any lab test that is abnormal or we need to change your treatment, we will call you to review the results.   Testing/Procedures: None ordered    Follow-Up: At [facility], you and your health needs are our priority.  As part of our continuing mission to provide you with exceptional heart care, we have created designated Provider Care Teams.  These Care Teams include your primary Cardiologist (physician) and Advanced Practice Providers (APPs -  Physician Assistants and Nurse Practitioners) who all work together to provide you with the care you need, when you need it.  We recommend signing up for the patient portal called "MyChart".  Sign up information is provided on this After Visit Summary.  MyChart is used to connect with patients for Virtual Visits (Telemedicine).  Patients are able to view lab/test results, encounter notes, upcoming appointments, etc.  Non-urgent messages can be sent to your provider as well.   To learn more about what you can do with MyChart, go to [email or web address].    Your next appointment:   12 month(s)  The format for your next appointment:   In Person  Provider:   Jenkins Rouge, MD  or Ambrose Pancoast, NP         Other Instructions   Important Information About Sugar

## 2022-09-21 ENCOUNTER — Other Ambulatory Visit: Payer: Self-pay | Admitting: Nurse Practitioner

## 2022-10-14 ENCOUNTER — Telehealth: Payer: Self-pay | Admitting: Family Medicine

## 2022-10-14 DIAGNOSIS — H11421 Conjunctival edema, right eye: Secondary | ICD-10-CM

## 2022-10-14 NOTE — Telephone Encounter (Signed)
Pt states she spoke with pcp regarding an eye condition and was told that she needs to see an ophthalmologist. Says she has called various practices and gotten no appointment. Wondering if she needs a referral and if so would you enter one for her

## 2022-10-16 NOTE — Telephone Encounter (Signed)
[facility] for referral to ophthalmology for R eye edema.

## 2022-10-18 NOTE — Addendum Note (Signed)
Addended by: Anderson Malta on: 10/18/2022 08:53 AM   Modules accepted: Orders

## 2022-10-18 NOTE — Telephone Encounter (Signed)
Referral placed.

## 2022-10-20 ENCOUNTER — Telehealth: Payer: Self-pay | Admitting: Family Medicine

## 2022-10-20 NOTE — Telephone Encounter (Signed)
Pt requesting a call so that she can provide an update on her recent visit with the opthalmololgist

## 2022-10-22 NOTE — Telephone Encounter (Signed)
ATC pt, no answer, LM to call office back.

## 2022-11-26 NOTE — Addendum Note (Signed)
Addended by: Elza Rafter D on: 11/26/2022 09:37 AM   Modules accepted: Orders

## 2022-11-26 NOTE — Telephone Encounter (Signed)
Pt states that she went to eye doctor. Was diagnosed with allergies in eye. Is taking steriod eye drops for 1 more week. Per OV on 08/02/22: Conjunctival edema of right eye  -Discussed possible causes including autoimmune disorders such as iritis or uveitis -Obtain labs -Advised to follow-up with ophthalmology. "  Pt does not have any autoimmune disorders per pt. She will be seeing her "regular" eye doctor next Weds to annual eye exam. This is updated in her chart.

## 2023-01-06 IMAGING — DX DG KNEE COMPLETE 4+V*R*
4 series · 4 of 4 positions shown · non-contrast
Comparison: None.

CLINICAL DATA: Chronic medial right knee pain

EXAM:
RIGHT KNEE - COMPLETE 4+ VIEW

[knee ap]
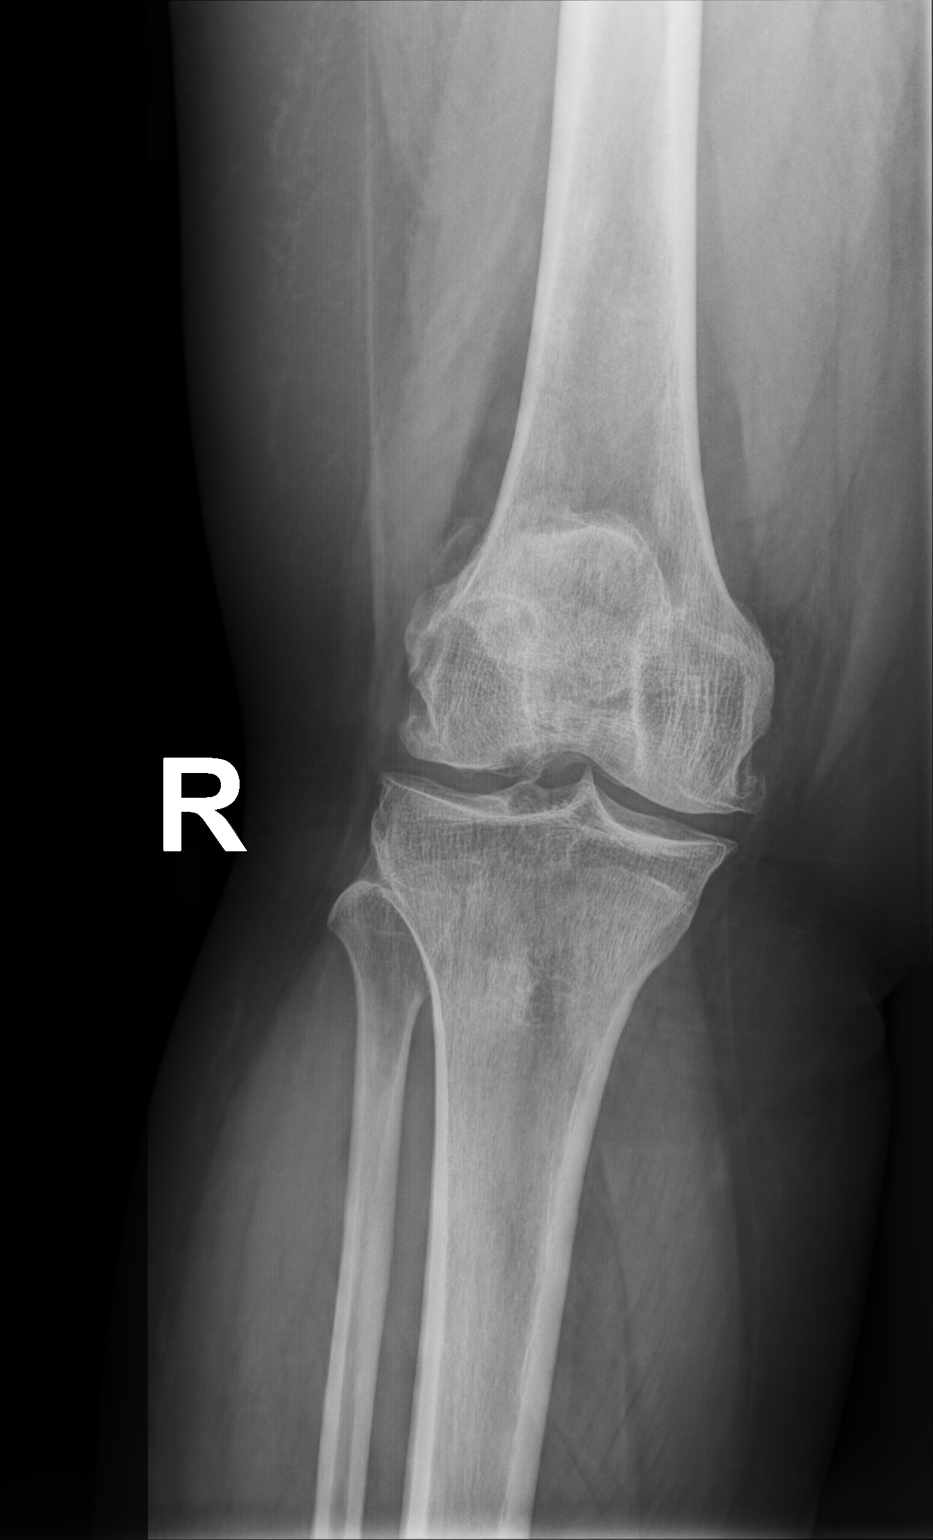

[knee [person_name] view pa]
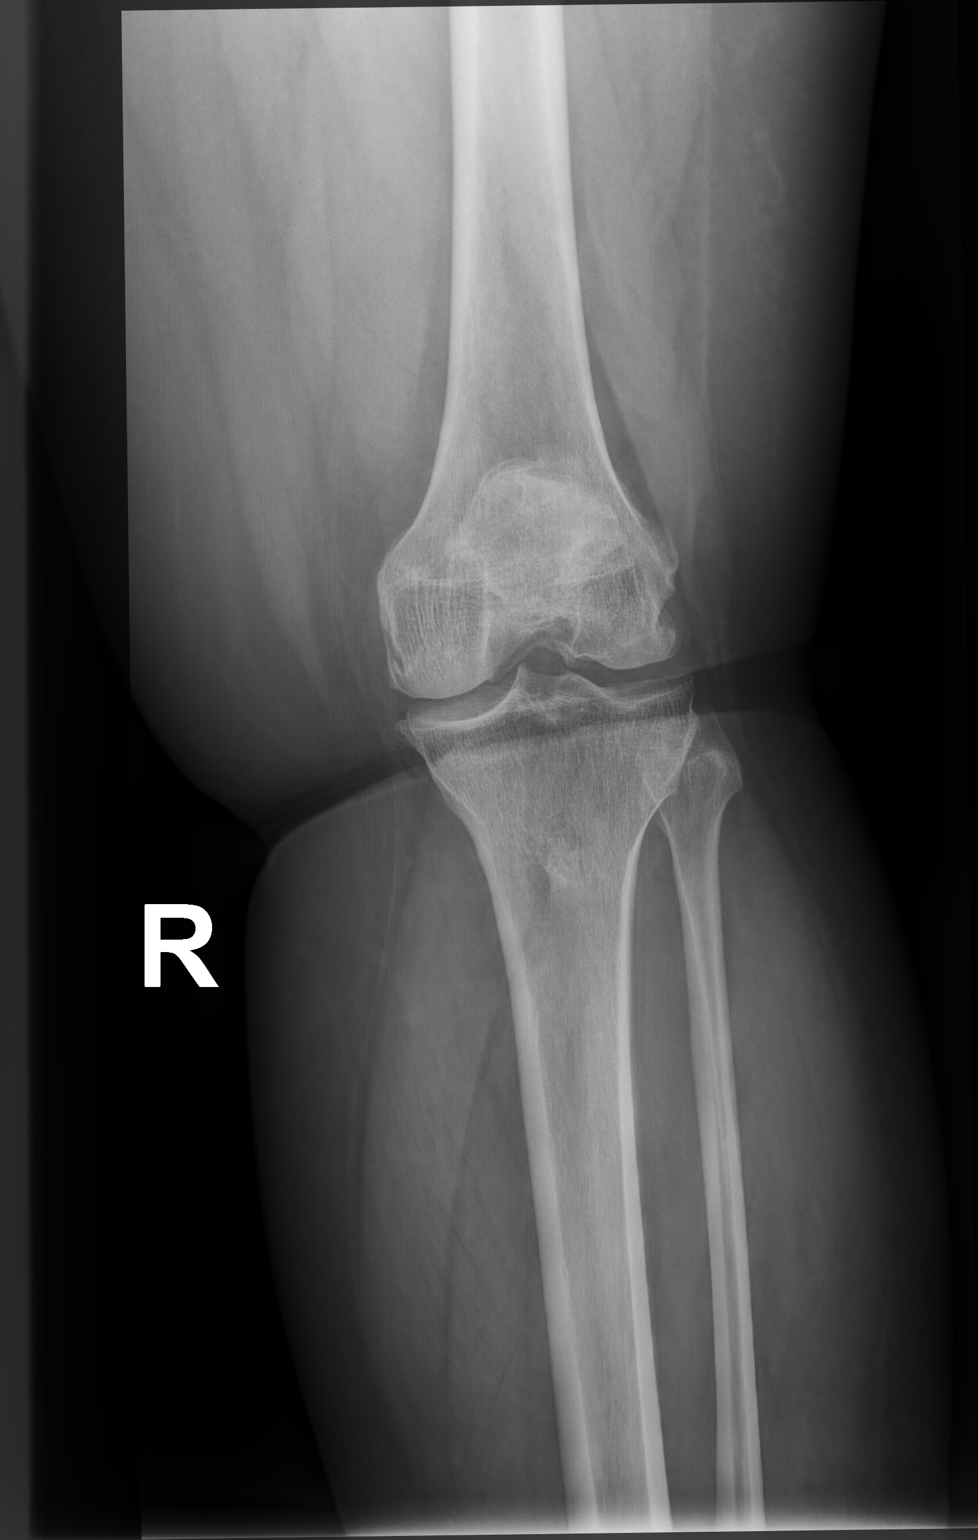

[patella (sunrise) tan]
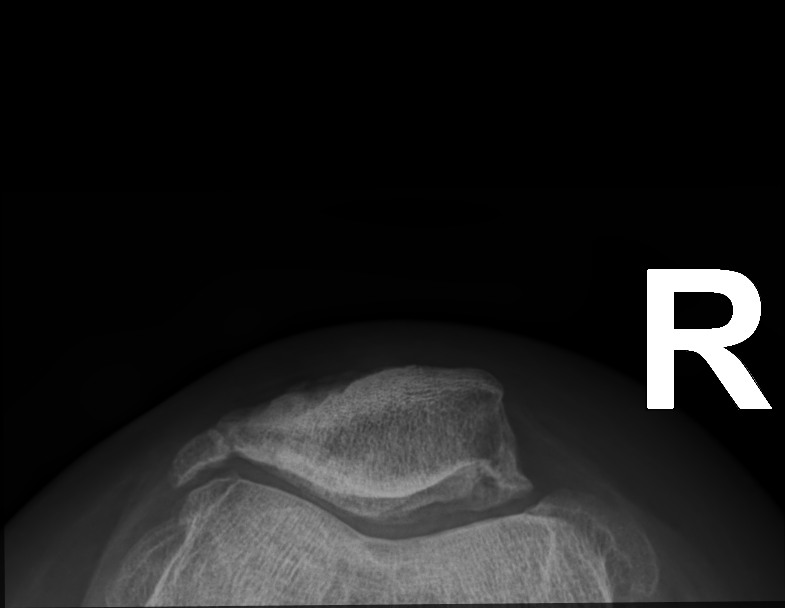

[knee lat]
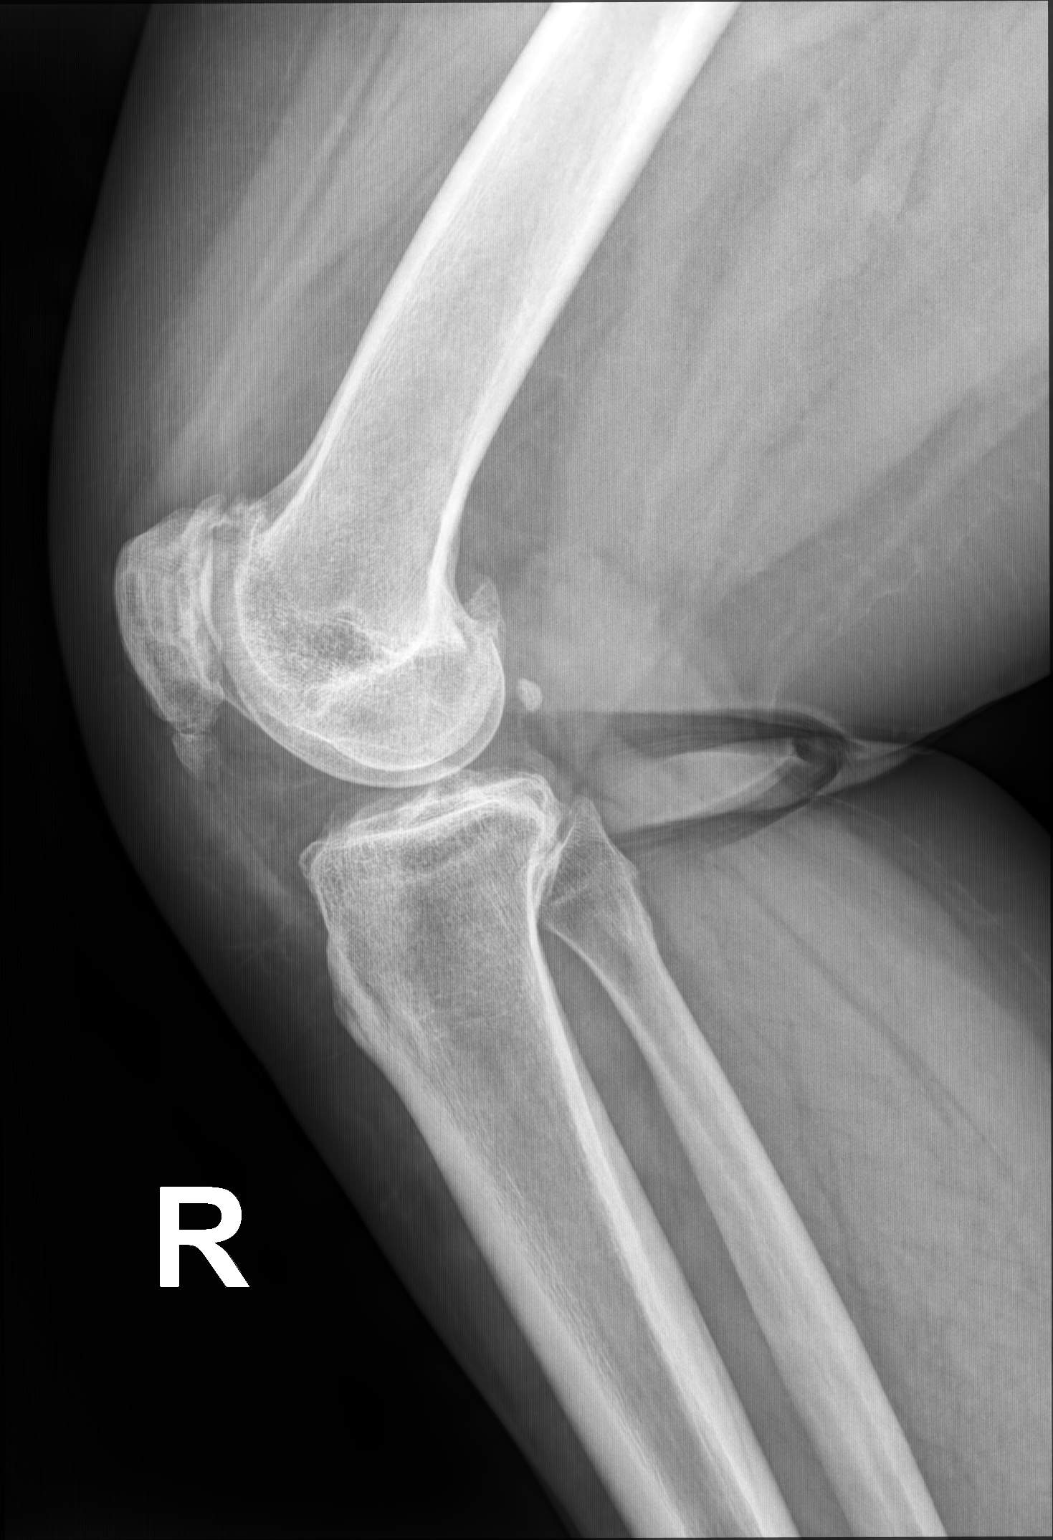

[4 of 4 positions shown; findings below may reference images not displayed]

FINDINGS: Moderate tricompartmental osteoarthritis with joint space loss,
sclerosis and osteophyte formation. No acute osseous finding,
fracture or effusion. Patellofemoral compartment appears most
severe. No focal soft tissue abnormality.
IMPRESSION: Tricompartmental right knee osteoarthritis as above.

No acute osseous finding by plain radiography.

## 2023-02-24 ENCOUNTER — Ambulatory Visit: Payer: 59 | Admitting: Internal Medicine

## 2023-02-24 VITALS — BP 130/80 | HR 62 | Temp 98.5°F | Wt 213.0 lb

## 2023-02-24 DIAGNOSIS — A084 Viral intestinal infection, unspecified: Secondary | ICD-10-CM

## 2023-02-24 DIAGNOSIS — R197 Diarrhea, unspecified: Secondary | ICD-10-CM | POA: Diagnosis not present

## 2023-02-24 LAB — POC COVID19 BINAXNOW: SARS Coronavirus 2 Ag: NEGATIVE

## 2023-02-24 MED ORDER — ONDANSETRON 4 MG PO TBDP: 4.0000 mg | ORAL_TABLET | Freq: Three times a day (TID) | ORAL | 0 refills | Status: DC | PRN

## 2023-02-24 NOTE — Progress Notes (Signed)
Established Patient Office Visit     CC/Reason for Visit: Nausea, vomiting, diarrhea  HPI: Emily Myers is a 47 y.o. female who is coming in today for the above mentioned reasons.  On Monday evening she started experiencing fatigue, lightheadedness, promptly followed by nausea, vomiting and diarrhea.  No sick contacts.  She did have several events over the weekend that she attended.   Past Medical/Surgical History: Past Medical History:  Diagnosis Date   Anemia    Blood transfusion without reported diagnosis 06/2018   [facility] - 2 units transfused   Endometrial polyp    Benign   Heart murmur    as a child, never has caused any problems   HSV infection    SVD (spontaneous vaginal delivery)    x 2    Past Surgical History:  Procedure Laterality Date   D&C x 2     Polyp removal 2011 by Dr. Cheri Rous in [place]     Hysterosalpingography     iron infusions     KNEE ARTHROSCOPY W/ DEBRIDEMENT  2022   MYOMECTOMY N/A 12/14/2018   Procedure: Exploratory Laparotomy MYOMECTOMY;  Surgeon: Servando Salina, MD;  Location: [facility];  Service: Gynecology;  Laterality: N/A;  2hrs.   WISDOM TOOTH EXTRACTION      Social History:  reports that she has never smoked. She has been exposed to tobacco smoke. She has never used smokeless tobacco. She reports current alcohol use. She reports that she does not use drugs.  Allergies: Allergies  Allergen Reactions   Aspirin Hives    Family History:  Family History  Problem Relation Age of Onset   Colon polyps Mother    Eczema Mother    Colon cancer Neg Hx    Esophageal cancer Neg Hx    Stomach cancer Neg Hx    Rectal cancer Neg Hx      Current Outpatient Medications:    Chlorpheniramine Maleate (ALLERGY PO), Take 1 tablet by mouth daily., Disp: , Rfl:    FOLIC ACID PO, Take 1 tablet by mouth daily., Disp: , Rfl:    furosemide (LASIX) 20 MG tablet, TAKE 1 TABLET(20 MG) BY MOUTH DAILY  AS NEEDED FOR SWELLING, Disp: 30 tablet, Rfl: 2   ondansetron (ZOFRAN-ODT) 4 MG disintegrating tablet, Take 1 tablet (4 mg total) by mouth every 8 (eight) hours as needed for nausea or vomiting., Disp: 20 tablet, Rfl: 0   ferrous sulfate 325 (65 FE) MG tablet, Take 1 tablet (325 mg total) by mouth daily with breakfast. Increase to twice daily as tolerated (Patient taking differently: Take 650 mg by mouth 2 (two) times daily with a meal.), Disp: 60 tablet, Rfl: 0  Review of Systems:  Negative unless indicated in HPI.   Physical Exam: Vitals:   02/24/23 1529  BP: 130/80  Pulse: 62  Temp: 98.5 F (36.9 C)  TempSrc: Oral  SpO2: 100%  Weight: 213 lb (96.6 kg)    Body mass index is 38.34 kg/m.   Physical Exam Vitals reviewed.  Constitutional:      Appearance: Normal appearance.  HENT:     Head: Normocephalic and atraumatic.  Eyes:     Conjunctiva/sclera: Conjunctivae normal.  Skin:    General: Skin is warm and dry.  Neurological:     General: No focal deficit present.     Mental Status: She is alert and oriented to person, place, and time.  Psychiatric:  Mood and Affect: Mood normal.        Behavior: Behavior normal.        Thought Content: Thought content normal.        Judgment: Judgment normal.      Impression and Plan:  Viral gastroenteritis - Plan: ondansetron (ZOFRAN-ODT) 4 MG disintegrating tablet  Diarrhea, unspecified type - Plan: POC COVID-19  -In office COVID test is negative. -Likely acute viral gastroenteritis, she is symptomatically improved, however still with nausea.  Zofran will be sent.  Advised to hydrate with electrolyte fluids such as Gatorade.  She knows to follow-up with [place] if symptoms fail to improve.  Time spent:23 minutes reviewing chart, interviewing and examining patient and formulating plan of care.     Lelon Frohlich, MD [facility]

## 2023-03-28 ENCOUNTER — Ambulatory Visit (INDEPENDENT_AMBULATORY_CARE_PROVIDER_SITE_OTHER): Payer: 59

## 2023-03-28 ENCOUNTER — Ambulatory Visit: Payer: 59 | Admitting: Family Medicine

## 2023-03-28 ENCOUNTER — Encounter: Payer: Self-pay | Admitting: Family Medicine

## 2023-03-28 ENCOUNTER — Telehealth: Payer: Self-pay

## 2023-03-28 VITALS — BP 130/78 | HR 67 | Temp 98.5°F | Ht 62.5 in | Wt 219.4 lb

## 2023-03-28 DIAGNOSIS — M79672 Pain in left foot: Secondary | ICD-10-CM | POA: Diagnosis not present

## 2023-03-28 DIAGNOSIS — D259 Leiomyoma of uterus, unspecified: Secondary | ICD-10-CM

## 2023-03-28 DIAGNOSIS — D500 Iron deficiency anemia secondary to blood loss (chronic): Secondary | ICD-10-CM

## 2023-03-28 DIAGNOSIS — G8929 Other chronic pain: Secondary | ICD-10-CM

## 2023-03-28 DIAGNOSIS — R5383 Other fatigue: Secondary | ICD-10-CM | POA: Diagnosis not present

## 2023-03-28 DIAGNOSIS — M25512 Pain in left shoulder: Secondary | ICD-10-CM | POA: Diagnosis not present

## 2023-03-28 LAB — CBC WITH DIFFERENTIAL/PLATELET
Basophils Absolute: 0.1 10*3/uL (ref 0.0–0.1)
Basophils Relative: 1.6 % (ref 0.0–3.0)
Eosinophils Absolute: 0.2 10*3/uL (ref 0.0–0.7)
Eosinophils Relative: 2.3 % (ref 0.0–5.0)
HCT: 23.1 % — CL (ref 36.0–46.0)
Hemoglobin: 6.8 g/dL — CL (ref 12.0–15.0)
Lymphocytes Relative: 40.5 % (ref 12.0–46.0)
Lymphs Abs: 2.9 10*3/uL (ref 0.7–4.0)
MCHC: 29.6 g/dL — ABNORMAL LOW (ref 30.0–36.0)
MCV: 60.9 fl — ABNORMAL LOW (ref 78.0–100.0)
Monocytes Absolute: 0.5 10*3/uL (ref 0.1–1.0)
Monocytes Relative: 6.8 % (ref 3.0–12.0)
Neutro Abs: 3.5 10*3/uL (ref 1.4–7.7)
Neutrophils Relative %: 48.8 % (ref 43.0–77.0)
Platelets: 591 10*3/uL — ABNORMAL HIGH (ref 150.0–400.0)
RBC: 3.79 Mil/uL — ABNORMAL LOW (ref 3.87–5.11)
RDW: 21.2 % — ABNORMAL HIGH (ref 11.5–15.5)
WBC: 7.1 10*3/uL (ref 4.0–10.5)

## 2023-03-28 LAB — VITAMIN B12: Vitamin B-12: 202 pg/mL — ABNORMAL LOW (ref 211–911)

## 2023-03-28 NOTE — Telephone Encounter (Signed)
CRITICAL VALUE STICKER  CRITICAL VALUE:   Hemoglobin: 6.8 Hematocrit: 23.1  RECEIVER (on-site recipient of call):  DATE & TIME NOTIFIED:   MESSENGER (representative from lab): Vianne Bulls   MD NOTIFIED: Dr. Volanda Napoleon  TIME OF NOTIFICATION:  RESPONSE:

## 2023-03-28 NOTE — Progress Notes (Signed)
Established Patient Office Visit   Subjective  Patient ID: Emily Myers, female    DOB: 03-02-1976  Age: 47 y.o. MRN: [id number]  Chief Complaint  Patient presents with   Fatigue    Pt reports she always feel tired due to her anemia. States she may needs iron infusion. Pt c/o dizzy and lightheaded this am. Denied of having it currently.   Foot Pain    Pt c/o sharp pain on L foot. Noticed it a month ago.     Patient is a 47 year old female with PMH SIG for anemia, fibroids, history of right knee pain who presents for follow-up.  Patient endorses progressively worsening fatigue times months.  Felt dizzy this morning.  Currently on menses which started 3 days ago.  States fibroids have remained small in size.  Patient thinks she may need an iron infusion.  Hemoglobin last checked 08/02/2022, was 8.7.  Patient notes menses typically q. 21-24 days.  Was absent in December for 45 days.  Patient took 2 negative pregnancy test.  Endorses shortness of breath with walking and palpitations.  Taking 2 iron tablets daily.  Patient having left shoulder pain.  Able to reach above head.  Does not want surgery at this time so wishes to wait on any workup.  Patient also notes lateral left foot pain times several months.  Pain is a sharp sensation that can occur at any time.  Patient denies injury.  Typically wears tennis shoes to work.  Occasionally has discomfort in bilateral lower legs when getting pedicure.  Patient denies swelling, erythema.    Patient Active Problem List   Diagnosis Date Noted   Chondral loose body of right knee joint 07/30/2021   Chondromalacia of medial femoral condyle, right 07/30/2021   S/P myomectomy 12/14/2018   Positive urine pregnancy test 07/13/2018   Symptomatic anemia 07/04/2018   Pelvic pain 07/04/2018   Uterine fibroid 07/04/2018   Ovarian cyst 07/04/2018   Seasonal and perennial allergic rhinitis 12/08/2017   Heart murmur 12/09/2013   Menorrhagia  12/04/2013   Past Surgical History:  Procedure Laterality Date   D&C x 2     Polyp removal 2011 by Dr. Cheri Rous in [place]     Hysterosalpingography     iron infusions     KNEE ARTHROSCOPY W/ DEBRIDEMENT  2022   MYOMECTOMY N/A 12/14/2018   Procedure: Exploratory Laparotomy MYOMECTOMY;  Surgeon: Servando Salina, MD;  Location: [facility];  Service: Gynecology;  Laterality: N/A;  2hrs.   WISDOM TOOTH EXTRACTION     Social History   Tobacco Use   Smoking status: Never    Passive exposure: Past   Smokeless tobacco: Never  Vaping Use   Vaping Use: Never used  Substance Use Topics   Alcohol use: Yes    Comment: social wine   Drug use: No   Family History  Problem Relation Age of Onset   Colon polyps Mother    Eczema Mother    Colon cancer Neg Hx    Esophageal cancer Neg Hx    Stomach cancer Neg Hx    Rectal cancer Neg Hx    Allergies  Allergen Reactions   Aspirin Hives      ROS Negative unless stated above    Objective:     BP 130/78 (BP Location: Right Arm, Patient Position: Sitting, Cuff Size: Large)   Pulse 67   Temp 98.5 F (36.9 C) (Oral)   Ht 5' 2.5" (  1.588 m)   Wt 219 lb 6.4 oz (99.5 kg)   LMP 03/25/2023 (Exact Date)   SpO2 99%   BMI 39.49 kg/m    Physical Exam Constitutional:      General: She is not in acute distress.    Comments: Appears mildly fatigued.  HENT:     Head: Normocephalic and atraumatic.     Nose: Nose normal.     Mouth/Throat:     Mouth: Mucous membranes are moist.  Eyes:     Extraocular Movements: Extraocular movements intact.     Conjunctiva/sclera: Conjunctivae normal.     Pupils: Pupils are equal, round, and reactive to light.     Comments: Pale conjunctiva.  Cardiovascular:     Rate and Rhythm: Normal rate and regular rhythm.     Heart sounds: Normal heart sounds. No murmur heard.    No gallop.  Pulmonary:     Effort: Pulmonary effort is normal. No respiratory distress.      Breath sounds: Normal breath sounds. No wheezing, rhonchi or rales.  Skin:    General: Skin is warm and dry.  Neurological:     Mental Status: She is alert and oriented to person, place, and time.      No results found for any visits on 03/28/23.    Assessment & Plan:  Iron deficiency anemia due to chronic blood loss -2/2 menses and fibroids -Obtain labs -Continue taking iron supplements twice daily. -If needed schedule iron infusion. -     Iron, TIBC and Ferritin Panel -     CBC with Differential/Platelet  Fatigue, unspecified type -Possibly 2/2 iron deficiency anemia anemia. -     Iron, TIBC and Ferritin Panel -     CBC with Differential/Platelet -     Vitamin B12  Left foot pain -Discussed possible causes including stress fracture, arthritis, sprain however patient does not recall injury -Obtain imaging. -Supportive care including rest, ice, heat, compression -Further recommendations based on imaging. -     DG Foot Complete Left  Chronic left shoulder pain -Possible rotator cuff tear.  Offered referral.  Patient declines at this time.  Uterine leiomyoma, unspecified location -Stable -Continue follow-up with OB/GYN. -     Iron, TIBC and Ferritin Panel -     CBC with Differential/Platelet    Return if symptoms worsen or fail to improve.   Billie Ruddy, MD

## 2023-03-29 LAB — IRON,TIBC AND FERRITIN PANEL
%SAT: 3 % (calc) — ABNORMAL LOW (ref 16–45)
Ferritin: 1 ng/mL — ABNORMAL LOW (ref 16–232)
Iron: 15 ug/dL — ABNORMAL LOW (ref 40–190)
TIBC: 506 mcg/dL (calc) — ABNORMAL HIGH (ref 250–450)

## 2023-03-31 ENCOUNTER — Telehealth: Payer: Self-pay | Admitting: Family Medicine

## 2023-03-31 NOTE — Telephone Encounter (Signed)
Attempted to reach pt.  Left a detail message that it was faxed to them.   To call us back if have any question.

## 2023-03-31 NOTE — Telephone Encounter (Signed)
Pt was seen on Monday - 03/28/23.  Pt states that she contacted [facility] and was told they faxed over a request to MD for additional information.    Pt just wanted to make sure MD/CMA were working on it.  Please advise.

## 2023-05-02 ENCOUNTER — Ambulatory Visit: Payer: 59 | Admitting: Family Medicine

## 2023-09-13 ENCOUNTER — Ambulatory Visit: Payer: 59 | Admitting: Family Medicine

## 2023-09-13 ENCOUNTER — Encounter: Payer: Self-pay | Admitting: Family Medicine

## 2023-09-13 VITALS — BP 118/74 | HR 56 | Temp 98.7°F | Wt 226.0 lb

## 2023-09-13 DIAGNOSIS — J3489 Other specified disorders of nose and nasal sinuses: Secondary | ICD-10-CM | POA: Diagnosis not present

## 2023-09-13 DIAGNOSIS — B958 Unspecified staphylococcus as the cause of diseases classified elsewhere: Secondary | ICD-10-CM

## 2023-09-13 MED ORDER — MUPIROCIN 2 % EX OINT: 1.0000 | TOPICAL_OINTMENT | Freq: Four times a day (QID) | CUTANEOUS | 0 refills | Status: DC

## 2023-09-13 MED ORDER — DOXYCYCLINE HYCLATE 100 MG PO TABS: 100.0000 mg | ORAL_TABLET | Freq: Two times a day (BID) | ORAL | 0 refills | Status: DC

## 2023-09-13 NOTE — Progress Notes (Signed)
Subjective:    Patient ID: Emily Myers, female    DOB: February 18, 1976, 47 y.o.   MRN: [id number]  HPI Here for one week of thick nasal drainage with crusting on the upper lip. She has also had clusters of small pustules over both cheeks. She feels fine in general. No fever.    Review of Systems  Constitutional: Negative.   HENT:  Positive for rhinorrhea. Negative for congestion, ear pain, postnasal drip, sinus pressure and sore throat.   Eyes: Negative.   Respiratory: Negative.    Skin:  Positive for rash.       Objective:   Physical Exam Constitutional:      Appearance: Normal appearance. She is not ill-appearing.  HENT:     Right Ear: Tympanic membrane, ear canal and external ear normal.     Left Ear: Tympanic membrane, ear canal and external ear normal.     Nose: Nose normal.     Mouth/Throat:     Pharynx: Oropharynx is clear.  Eyes:     Conjunctiva/sclera: Conjunctivae normal.  Pulmonary:     Effort: Pulmonary effort is normal.     Breath sounds: Normal breath sounds.  Lymphadenopathy:     Cervical: No cervical adenopathy.  Skin:    Comments: There are clusters of small non-tender pustules on both cheeks   Neurological:     Mental Status: She is alert.           Assessment & Plan:  Staph infection, treat with Doxycycline 100 ng BID for 10 days and Mupiricin ointment in the nostrils QID.  Gershon Crane, MD

## 2023-10-04 LAB — HM MAMMOGRAPHY

## 2023-10-06 ENCOUNTER — Encounter: Payer: Self-pay | Admitting: Family Medicine

## 2024-02-29 ENCOUNTER — Encounter: Payer: Self-pay | Admitting: Family Medicine

## 2024-02-29 ENCOUNTER — Telehealth: Admitting: Family Medicine

## 2024-02-29 DIAGNOSIS — R051 Acute cough: Secondary | ICD-10-CM

## 2024-02-29 DIAGNOSIS — R5383 Other fatigue: Secondary | ICD-10-CM

## 2024-02-29 DIAGNOSIS — J069 Acute upper respiratory infection, unspecified: Secondary | ICD-10-CM | POA: Diagnosis not present

## 2024-02-29 MED ORDER — BENZONATATE 200 MG PO CAPS: 200.0000 mg | ORAL_CAPSULE | Freq: Two times a day (BID) | ORAL | 0 refills | Status: DC | PRN

## 2024-02-29 NOTE — Patient Instructions (Signed)
Here is a list of some of the area OB/Gyn providers.  It is not an all inclusive list but should help you get started.  [facility] -Jaymes Graff, MD  -Hoover Browns, MD - Osborn Coho, MD -Marline Backbone, MD ---Dierdre Forth, MD is listed on their site, but I'm pretty sure she retired.  [facility] -Ellison Hughs, MD -Pryor Ochoa, DO  [facility] -Gerald Leitz, MD  -Steva Ready, DO  [facility] -Derl Barrow, MD -Marlow Baars, MD  [facility] Shea Evans, MD  [facility] for [facility] Yevette Edwards, MD  Leda Quail, MD ---only does gynecology now, not obstetrics.

## 2024-02-29 NOTE — Progress Notes (Signed)
 Virtual Visit via Video Note  I connected with Emily Myers on 02/29/24 at  4:45 PM EST by a video enabled telemedicine application and verified that I am speaking with the correct person using two identifiers.  Location patient: home Location provider:work or home office Persons participating in the virtual visit: patient, provider  I discussed the limitations of evaluation and management by telemedicine and the availability of in person appointments. The patient expressed understanding and agreed to proceed. Chief Complaint  Patient presents with   Cough    Started 4 days ago, cough, congestion, body aches, headache, chills, fever (101.0 on Sunday)    HPI: Pt is a 48 yo female seen for acute illness.  Pt states she started feeling chills, body aches, fever, fatigue on Satuday afternoon 02/25/24.  One of her co-workers had influenza A.  Pt has slept for the last few days.  Had night sweats, cough.  Denies ST, facial pain/pressure.  Took tylenol for HA, Robitussin DM, chicken noodle soup, mucinex and doculax.  Muscles not as sore.   Staying hydrated.  Out of work Mon, [employer], Wed.  ROS: See pertinent positives and negatives per HPI.  Past Medical History:  Diagnosis Date   Anemia    Blood transfusion without reported diagnosis 06/2018   [facility] - 2 units transfused   Endometrial polyp    Benign   Heart murmur    as a child, never has caused any problems   HSV infection    SVD (spontaneous vaginal delivery)    x 2    Past Surgical History:  Procedure Laterality Date   D&C x 2     Polyp removal 2011 by Dr. Lynnette Caffey in [place]   DILATION AND CURETTAGE OF UTERUS     Hysterosalpingography     iron infusions     KNEE ARTHROSCOPY W/ DEBRIDEMENT  2022   MYOMECTOMY N/A 12/14/2018   Procedure: Exploratory Laparotomy MYOMECTOMY;  Surgeon: Maxie Better, MD;  Location: [facility];  Service: Gynecology;  Laterality: N/A;  2hrs.   WISDOM TOOTH EXTRACTION      Family History  Problem  Relation Age of Onset   Colon polyps Mother    Eczema Mother    Colon cancer Neg Hx    Esophageal cancer Neg Hx    Stomach cancer Neg Hx    Rectal cancer Neg Hx      Current Outpatient Medications:    Chlorpheniramine Maleate (ALLERGY PO), Take 1 tablet by mouth daily., Disp: , Rfl:    ferrous sulfate 325 (65 FE) MG tablet, Take 325 mg by mouth daily with breakfast., Disp: , Rfl:    FOLIC ACID PO, Take 1 tablet by mouth daily., Disp: , Rfl:   EXAM:  VITALS per patient if applicable:  RR between 12-20 bpm  GENERAL: alert, oriented, appears tired, in no acute distress  HEENT: atraumatic, conjunctiva clear, no obvious abnormalities on inspection of external nose and ears  NECK: normal movements of the head and neck  LUNGS: on inspection no signs of respiratory distress, breathing rate appears normal, no obvious gross SOB, gasping or wheezing  CV: no obvious cyanosis  MS: moves all visible extremities without noticeable abnormality  PSYCH/NEURO: pleasant and cooperative, no obvious depression or anxiety, speech and thought processing grossly intact  ASSESSMENT AND PLAN:  Discussed the following assessment and plan:  Viral upper respiratory tract infection  Acute cough - Plan: benzonatate (TESSALON) 200 MG capsule  Other fatigue  Pt with acute URI symptoms likely  viral.  Continue supportive care with OTC cough/cold meds, rest, hydration, flonase, tylenol or NSAIDs.  Given note for work.  Given strict precautions for continued or worsened symptoms.  F/u prn.  Provided with list of OB/Gyn providers per request.    I discussed the assessment and treatment plan with the patient. The patient was provided an opportunity to ask questions and all were answered. The patient agreed with the plan and demonstrated an understanding of the instructions.   The patient was advised to call back or seek an in-person evaluation if the symptoms worsen or if the condition fails to improve as  anticipated.   Deeann Saint, MD

## 2024-02-29 NOTE — Progress Notes (Signed)
 Patient was unable to self-report due to a lack of equipment at home via telehealth

## 2024-04-11 ENCOUNTER — Ambulatory Visit (INDEPENDENT_AMBULATORY_CARE_PROVIDER_SITE_OTHER): Admitting: Family Medicine

## 2024-04-11 ENCOUNTER — Encounter: Payer: Self-pay | Admitting: Family Medicine

## 2024-04-11 VITALS — BP 110/68 | HR 73 | Temp 98.3°F | Ht 62.5 in | Wt 227.2 lb

## 2024-04-11 DIAGNOSIS — D500 Iron deficiency anemia secondary to blood loss (chronic): Secondary | ICD-10-CM

## 2024-04-11 DIAGNOSIS — R61 Generalized hyperhidrosis: Secondary | ICD-10-CM

## 2024-04-11 DIAGNOSIS — Z0000 Encounter for general adult medical examination without abnormal findings: Secondary | ICD-10-CM

## 2024-04-11 DIAGNOSIS — E782 Mixed hyperlipidemia: Secondary | ICD-10-CM

## 2024-04-11 DIAGNOSIS — R5383 Other fatigue: Secondary | ICD-10-CM

## 2024-04-11 DIAGNOSIS — Z1159 Encounter for screening for other viral diseases: Secondary | ICD-10-CM

## 2024-04-11 LAB — COMPREHENSIVE METABOLIC PANEL WITH GFR
ALT: 10 U/L (ref 0–35)
AST: 14 U/L (ref 0–37)
Albumin: 4.3 g/dL (ref 3.5–5.2)
Alkaline Phosphatase: 64 U/L (ref 39–117)
BUN: 13 mg/dL (ref 6–23)
CO2: 25 meq/L (ref 19–32)
Calcium: 9.1 mg/dL (ref 8.4–10.5)
Chloride: 105 meq/L (ref 96–112)
Creatinine, Ser: 0.73 mg/dL (ref 0.40–1.20)
GFR: 97.89 mL/min (ref >60.00)
Glucose, Bld: 92 mg/dL (ref 70–99)
Potassium: 4.3 meq/L (ref 3.5–5.1)
Sodium: 138 meq/L (ref 135–145)
Total Bilirubin: 0.3 mg/dL (ref 0.2–1.2)
Total Protein: 6.7 g/dL (ref 6.0–8.3)

## 2024-04-11 LAB — CBC WITH DIFFERENTIAL/PLATELET
Basophils Absolute: 0.1 10*3/uL (ref 0.0–0.1)
Basophils Relative: 0.9 % (ref 0.0–3.0)
Eosinophils Absolute: 0.1 10*3/uL (ref 0.0–0.7)
Eosinophils Relative: 2 % (ref 0.0–5.0)
HCT: 27.1 % — ABNORMAL LOW (ref 36.0–46.0)
Hemoglobin: 8.1 g/dL — ABNORMAL LOW (ref 12.0–15.0)
Lymphocytes Relative: 27.9 % (ref 12.0–46.0)
Lymphs Abs: 2.1 10*3/uL (ref 0.7–4.0)
MCHC: 29.9 g/dL — ABNORMAL LOW (ref 30.0–36.0)
MCV: 68.3 fl — ABNORMAL LOW (ref 78.0–100.0)
Monocytes Absolute: 0.7 10*3/uL (ref 0.1–1.0)
Monocytes Relative: 9.4 % (ref 3.0–12.0)
Neutro Abs: 4.4 10*3/uL (ref 1.4–7.7)
Neutrophils Relative %: 59.8 % (ref 43.0–77.0)
Platelets: 604 10*3/uL — ABNORMAL HIGH (ref 150.0–400.0)
RBC: 3.96 Mil/uL (ref 3.87–5.11)
RDW: 21.6 % — ABNORMAL HIGH (ref 11.5–15.5)
WBC: 7.4 10*3/uL (ref 4.0–10.5)

## 2024-04-11 LAB — T4, FREE: Free T4: 0.67 ng/dL (ref 0.60–1.60)

## 2024-04-11 LAB — TSH: TSH: 2.07 u[IU]/mL (ref 0.35–5.50)

## 2024-04-11 LAB — LIPID PANEL
Cholesterol: 184 mg/dL (ref 0–200)
HDL: 56.7 mg/dL (ref >39.00)
LDL Cholesterol: 113 mg/dL — ABNORMAL HIGH (ref 0–99)
NonHDL: 127.79
Total CHOL/HDL Ratio: 3
Triglycerides: 73 mg/dL (ref 0.0–149.0)
VLDL: 14.6 mg/dL (ref 0.0–40.0)

## 2024-04-11 LAB — HEMOGLOBIN A1C: Hgb A1c MFr Bld: 6.1 % (ref 4.6–6.5)

## 2024-04-11 NOTE — Patient Instructions (Addendum)
 Congratulations on your upcoming graduation !!!   You can try over-the-counter supplements such as black cohosh or Estroven for night sweats symptoms.  There are also prescription medications such as gabapentin or Veozah for hot flash/night sweats symptoms.  Here is a list of some of the area OB/Gyn providers.  It is not an all inclusive list but should help you get started.  [facility] -Marylu Soda, MD  -Vernal Gold, MD - Renea Carrion, MD -Jeanmarie Millet, MD ---Shellee Devonshire, MD is listed on their site, but I'm pretty sure she retired.  [facility] -Ethridge Herder, MD -Sandra Crouch, DO  [facility] -Arlee Lace, MD  -Meldon Sport, DO  [facility] -Zenia Hight, MD -Luan Rumpf, MD  [facility] Terri Fester, MD  [facility] for [facility] Claudina Cullen, MD  Cay Cocking, MD ---only does gynecology now, not obstetrics.   Dermatologist for you to look into: Roxann Copper, DO  [facility]  Dr. Mauricio Southerly [facility] [place], [place]  Dr. Darlen Eglin [facility]

## 2024-04-11 NOTE — Progress Notes (Signed)
 Established Patient Office Visit   Subjective  Patient ID: Emily Myers, female    DOB: [birth year]  Age: 48 y.o. MRN: [id number]  Chief Complaint  Patient presents with   Annual Exam    Patient is a 48 year old female seen for CPE.  Pt endorses increased perimenopausal symptoms such as night sweats and acne.  Pt inquires about area dermatologist.  Patient endorses continued fatigue.  Just had menses.  Feels like iron is low as she has been craving ice and red meat.  States her husband noticed her lips are pale.  Taking iron supplement daily.  Was to have hysterectomy done in December however was never contacted about scheduling.  Trying to walk at least 7000 steps per day as part of her work challenge.  Patient notes continued bilateral LE edema worse at the end of the day.  Improves the next then returns.  Wearing compression socks.  Pt will be graduating from [employer] in the next few wks. Patient's daughter will also be graduating from high school and going to [employer].    Patient Active Problem List   Diagnosis Date Noted   Chondral loose body of right knee joint 07/30/2021   Chondromalacia of medial femoral condyle, right 07/30/2021   S/P myomectomy 12/14/2018   Positive urine pregnancy test 07/13/2018   Symptomatic anemia 07/04/2018   Pelvic pain 07/04/2018   Uterine fibroid 07/04/2018   Ovarian cyst 07/04/2018   Seasonal and perennial allergic rhinitis 12/08/2017   Heart murmur 12/09/2013   Menorrhagia 12/04/2013   Past Medical History:  Diagnosis Date   Anemia    Blood transfusion without reported diagnosis 06/2018   [facility] - 2 units transfused   Endometrial polyp    Benign   Heart murmur    as a child, never has caused any problems   HSV infection    SVD (spontaneous vaginal delivery)    x 2   Past Surgical History:  Procedure Laterality Date   D&C x 2     Polyp removal 2011 by Dr. Myrle Aspen in [place]    DILATION AND CURETTAGE OF UTERUS      Hysterosalpingography     iron infusions     KNEE ARTHROSCOPY W/ DEBRIDEMENT  2022   MYOMECTOMY N/A 12/14/2018   Procedure: Exploratory Laparotomy MYOMECTOMY;  Surgeon: Ivery Marking, MD;  Location: [facility];  Service: Gynecology;  Laterality: N/A;  2hrs.   WISDOM TOOTH EXTRACTION     Social History   Tobacco Use   Smoking status: Never    Passive exposure: Past   Smokeless tobacco: Never  Vaping Use   Vaping status: Never Used  Substance Use Topics   Alcohol use: Yes    Comment: social wine   Drug use: No   Family History  Problem Relation Age of Onset   Colon polyps Mother    Eczema Mother    Colon cancer Neg Hx    Esophageal cancer Neg Hx    Stomach cancer Neg Hx    Rectal cancer Neg Hx    Allergies  Allergen Reactions   Aspirin Hives      ROS Negative unless stated above    Objective:     BP 110/68 (BP Location: Left Arm, Patient Position: Sitting, Cuff Size: Large)   Pulse 73   Temp 98.3 F (36.8 C) (Oral)   Ht 5' 2.5" (1.588 m)   Wt 227 lb 3.2 oz (103.1 kg)   LMP 04/05/2024   SpO2 93%  BMI 40.89 kg/m  BP Readings from Last 3 Encounters:  04/11/24 110/68  09/13/23 118/74  03/28/23 130/78   Wt Readings from Last 3 Encounters:  04/11/24 227 lb 3.2 oz (103.1 kg)  09/13/23 226 lb (102.5 kg)  03/28/23 219 lb 6.4 oz (99.5 kg)    Physical Exam Constitutional:      Appearance: Normal appearance.  HENT:     Head: Normocephalic and atraumatic.     Right Ear: Tympanic membrane, ear canal and external ear normal.     Left Ear: Tympanic membrane, ear canal and external ear normal.     Nose: Nose normal.     Mouth/Throat:     Mouth: Mucous membranes are moist.     Pharynx: No oropharyngeal exudate or posterior oropharyngeal erythema.  Eyes:     General: No scleral icterus.    Extraocular Movements: Extraocular movements intact.     Conjunctiva/sclera: Conjunctivae normal.     Pupils: Pupils are equal, round, and reactive to light.  Neck:      Thyroid: No thyromegaly.  Cardiovascular:     Rate and Rhythm: Normal rate and regular rhythm.     Pulses: Normal pulses.     Heart sounds: Normal heart sounds. No murmur heard.    No friction rub.  Pulmonary:     Effort: Pulmonary effort is normal.     Breath sounds: Normal breath sounds. No wheezing, rhonchi or rales.  Abdominal:     General: Bowel sounds are normal.     Palpations: Abdomen is soft.     Tenderness: There is no abdominal tenderness.  Musculoskeletal:        General: No deformity. Normal range of motion.  Lymphadenopathy:     Cervical: No cervical adenopathy.  Skin:    General: Skin is warm and dry.     Findings: No lesion.  Neurological:     General: No focal deficit present.     Mental Status: She is alert and oriented to person, place, and time.  Psychiatric:        Mood and Affect: Mood normal.        Thought Content: Thought content normal.       04/11/2024    8:35 AM 02/29/2024    4:41 PM 03/28/2023    8:50 AM  Depression screen PHQ 2/9  Decreased Interest 0 0 0  Down, Depressed, Hopeless 0 0 0  PHQ - 2 Score 0 0 0  Altered sleeping   0  Tired, decreased energy   3  Change in appetite   0  Feeling bad or failure about yourself    0  Trouble concentrating   0  Moving slowly or fidgety/restless   0  Suicidal thoughts   0  PHQ-9 Score   3      02/29/2024    4:41 PM 03/28/2023    8:50 AM  GAD 7 : Generalized Anxiety Score  Nervous, Anxious, on Edge 0 0  Control/stop worrying 0 0  Worry too much - different things 0 0  Trouble relaxing 0 0  Restless 0 0  Easily annoyed or irritable 0 0  Afraid - awful might happen 0 0  Total GAD 7 Score 0 0      No results found for any visits on 04/11/24.    Assessment & Plan:  Well adult exam -     Comprehensive metabolic panel with GFR -     CBC with Differential/Platelet -  TSH -     T4, free -     Hemoglobin A1c -     Lipid panel  Iron deficiency anemia due to chronic blood loss -     CBC  with Differential/Platelet -     Iron, TIBC and Ferritin Panel  Night sweats  Other fatigue  Mixed hyperlipidemia -     Lipid panel  Encounter for hepatitis C screening test for low risk patient -     Hepatitis C antibody  Age-appropriate health screenings discussed.  Will obtain labs.  Immunizations reviewed.  Pap done with OB/GYN, Dr. Lesta Rater.  Mammogram up-to-date done 10/04/2023.  Colonoscopy done 04/06/2022.  Continue daily p.o. iron supplement.  Schedule infusion if needed based on labs.  Discussed OTC supplement options for night sweats due to perimenopause.  Next CPE in 1 year.  Follow-up sooner as needed.  Return if symptoms worsen or fail to improve.   Viola Greulich, MD

## 2024-04-12 LAB — IRON,TIBC AND FERRITIN PANEL
%SAT: 4 % — ABNORMAL LOW (ref 16–45)
Ferritin: 10 ng/mL — ABNORMAL LOW (ref 16–232)
Iron: 20 ug/dL — ABNORMAL LOW (ref 40–190)
TIBC: 512 ug/dL — ABNORMAL HIGH (ref 250–450)

## 2024-04-12 LAB — HEPATITIS C ANTIBODY: Hepatitis C Ab: NONREACTIVE

## 2024-04-13 ENCOUNTER — Encounter: Payer: Self-pay | Admitting: Family Medicine

## 2024-04-13 ENCOUNTER — Other Ambulatory Visit: Payer: Self-pay | Admitting: Family Medicine

## 2024-05-23 ENCOUNTER — Telehealth: Payer: Self-pay

## 2024-05-23 NOTE — Telephone Encounter (Signed)
 Copied from CRM [id number]. Topic: General - Other >> May 23, 2024 11:27 AM Juleen Oakland F wrote: Reason for CRM: Amy from [facility] called to follow up on a fax she was suppose to receive from Dr. Arliss Lam - says she needs a plan of treatment for INFED to be faxed to [phone].

## 2024-05-24 NOTE — Telephone Encounter (Signed)
 Called and left a VM for Amy fro [facility], forms have refaxed

## 2024-05-28 ENCOUNTER — Telehealth: Payer: Self-pay

## 2024-05-28 NOTE — Telephone Encounter (Signed)
  Refaxed to Amy from [facility]    Copied from CRM #[id number]. Topic: Clinical - Prescription Issue >> May 28, 2024 10:45 AM Marlan Silva wrote: Reason for CRM: Amy calling from [facility], called on behalf of the patient stating that the patient needs iron infusions, and she needs correct plan of treatment sent over to them. Amy can be reached at [phone] she is requesting a call back as soon as possible.

## 2024-10-04 LAB — HM MAMMOGRAPHY

## 2024-11-27 ENCOUNTER — Ambulatory Visit: Payer: Self-pay

## 2024-11-27 NOTE — Telephone Encounter (Signed)
 Pt requested virtual, will call to convert to in person if she develops any SOB or wheezing.  Reason for Disposition  [1] Nasal discharge AND [2] present > 10 days  Answer Assessment - Initial Assessment Questions 11/18/24 sore throat, laryngitis, cough. Taking mucinex, dayquil, zycam, elderberry gummies and nyquil. On 11/24/24, cough began to be productive with green sputum.   1. ONSET: When did the cough begin?      11/24/24  2. SEVERITY: How bad is the cough today?      Less severe with nyquil  3. SPUTUM: Describe the color of your sputum (e.g., none, dry cough; clear, white, yellow, green)     Green  4. DIFFICULTY BREATHING: Are you having difficulty breathing? If Yes, ask: How bad is it? (e.g., mild, moderate, severe)      Denies  5. FEVER: Do you have a fever? If Yes, ask: What is your temperature, how was it measured, and when did it start?     Denies  6. CARDIAC HISTORY: Do you have any history of heart disease? (e.g., heart attack, congestive heart failure)      Murmur  8. LUNG HISTORY: Do you have any history of lung disease?  (e.g., pulmonary embolus, asthma, emphysema)     Denies  10. OTHER SYMPTOMS: Do you have any other symptoms? (e.g., runny nose, wheezing, chest pain)       See above  Protocols used: Cough - Acute Productive-A-AH  Copied from CRM #[id number]. Topic: Clinical - Red Word Triage >> Nov 26, 2024  3:23 PM Thersia BROCKS wrote: [facility] that prompted transfer to Nurse Triage: Patient called in stated she has had a cold , stated she was coughing reguarly , started taking mucux and has green phlgem  Past Medical History:  Diagnosis Date   Anemia    Blood transfusion without reported diagnosis 06/2018   [facility] - 2 units transfused   Endometrial polyp    Benign   Heart murmur    as a child, never has caused any problems   HSV infection    SVD (spontaneous vaginal delivery)    x 2

## 2024-11-28 ENCOUNTER — Encounter: Payer: Self-pay | Admitting: Family Medicine

## 2024-11-28 ENCOUNTER — Telehealth: Admitting: Family Medicine

## 2024-11-28 DIAGNOSIS — R490 Dysphonia: Secondary | ICD-10-CM

## 2024-11-28 DIAGNOSIS — R051 Acute cough: Secondary | ICD-10-CM | POA: Diagnosis not present

## 2024-11-28 DIAGNOSIS — J069 Acute upper respiratory infection, unspecified: Secondary | ICD-10-CM

## 2024-11-28 MED ORDER — HYDROCODONE BIT-HOMATROP MBR 5-1.5 MG/5ML PO SOLN: 5.0000 mL | Freq: Three times a day (TID) | ORAL | 0 refills | Status: AC | PRN

## 2024-11-28 NOTE — Progress Notes (Signed)
 Virtual Visit via Video Note  I connected with Emily Myers on 11/28/24 at  2:30 PM EST by a video enabled telemedicine application and verified that I am speaking with the correct person using two identifiers.  Location patient: home Location provider:work or home office Persons participating in the virtual visit: patient, provider  I discussed the limitations of evaluation and management by telemedicine and the availability of in person appointments. The patient expressed understanding and agreed to proceed. Chief Complaint  Patient presents with   Acute Visit    Patient calling about her cough since the 11/23. Had a sore throat on Sunday and patient had lost her voice, taking dayquil, nyquil, and mucinex.  Patient now coughing up green mucous the last 2 days.  Coughing most at bedtime.    HPI: Pt is a 48 yo female seen for acute concern. Pt woke up with a sore throat and hoarse voice on 11/23.  She then developed a productive cough with clear phlegm.  Coughing more at night.  Taking otc nyquil, dayquil, zycam.  Now productive cough with green sputum. No fever, chills, HA, diarrhea, no facial pain/pressure, ear pain/press.  Sick contacts include a 48 yo who was sick on 11/21.  Pt mentions LMP was 10/27/24.  Cycle typically regular.  It has been 33 days since last LMP.  Inquires if related to perimenopause.  Does not think she is pregnant, though not on birth control.  Has not taken a test.   Since last OFV pt graduated from school with a BS.    ROS: See pertinent positives and negatives per HPI.  Past Medical History:  Diagnosis Date   Anemia    Blood transfusion without reported diagnosis 06/2018   [facility] - 2 units transfused   Endometrial polyp    Benign   Heart murmur    as a child, never has caused any problems   HSV infection    SVD (spontaneous vaginal delivery)    x 2    Past Surgical History:  Procedure Laterality Date   D&C x 2     Polyp removal 2011 by Dr. Hercules  in [place]    DILATION AND CURETTAGE OF UTERUS     Hysterosalpingography     iron infusions     KNEE ARTHROSCOPY W/ DEBRIDEMENT  2022   MYOMECTOMY N/A 12/14/2018   Procedure: Exploratory Laparotomy MYOMECTOMY;  Surgeon: Rutherford Gain, MD;  Location: [facility];  Service: Gynecology;  Laterality: N/A;  2hrs.   WISDOM TOOTH EXTRACTION      Family History  Problem Relation Age of Onset   Colon polyps Mother    Eczema Mother    Colon cancer Neg Hx    Esophageal cancer Neg Hx    Stomach cancer Neg Hx    Rectal cancer Neg Hx      Current Outpatient Medications:    benzonatate  (TESSALON ) 200 MG capsule, Take 1 capsule (200 mg total) by mouth 2 (two) times daily as needed for cough. (Patient not taking: Reported on 04/11/2024), Disp: 20 capsule, Rfl: 0   Chlorpheniramine Maleate (ALLERGY PO), Take 1 tablet by mouth daily. (Patient not taking: Reported on 04/11/2024), Disp: , Rfl:    ferrous sulfate  325 (65 FE) MG tablet, Take 325 mg by mouth daily with breakfast., Disp: , Rfl:    FOLIC ACID  PO, Take 1 tablet by mouth daily., Disp: , Rfl:   EXAM:  VITALS per patient if applicable:  RR between 12-20 bpm  GENERAL: alert, oriented, appears  well and in no acute distress  HEENT: atraumatic, conjunctiva clear, no obvious abnormalities on inspection of external nose and ears  NECK: normal movements of the head and neck  LUNGS: on inspection no signs of respiratory distress, breathing rate appears normal, no obvious gross SOB, gasping or wheezing  CV: no obvious cyanosis  MS: moves all visible extremities without noticeable abnormality  PSYCH/NEURO: pleasant and cooperative, no obvious depression or anxiety, speech and thought processing grossly intact  ASSESSMENT AND PLAN:  Discussed the following assessment and plan:  Viral URI  Acute cough - Plan: HYDROcodone  bit-homatropine (HYCODAN) 5-1.5 MG/5ML syrup  Hoarseness of voice  Acute viral uri symptoms with cough.  Continue  supportive care with OTC meds, rest, hydration, vocal rest, etc.  Cough syrup sent to pharmacy.  Wait to pick up.  Use sparingly.  No med allergies.  Notify clinic for worsened sx.     I discussed the assessment and treatment plan with the patient. The patient was provided an opportunity to ask questions and all were answered. The patient agreed with the plan and demonstrated an understanding of the instructions.   The patient was advised to call back or seek an in-person evaluation if the symptoms worsen or if the condition fails to improve as anticipated.   Emily Myers Single, MD
